# Patient Record
Sex: Female | Born: 1950
Health system: Southern US, Community
[De-identification: ages and names within clinical notes are randomized; demographics above are authoritative.]

## PROBLEM LIST (undated history)

## (undated) DIAGNOSIS — M8580 Other specified disorders of bone density and structure, unspecified site: Secondary | ICD-10-CM

## (undated) DIAGNOSIS — E559 Vitamin D deficiency, unspecified: Secondary | ICD-10-CM

## (undated) DIAGNOSIS — J45909 Unspecified asthma, uncomplicated: Secondary | ICD-10-CM

## (undated) DIAGNOSIS — M1990 Unspecified osteoarthritis, unspecified site: Secondary | ICD-10-CM

## (undated) DIAGNOSIS — K635 Polyp of colon: Secondary | ICD-10-CM

## (undated) DIAGNOSIS — G629 Polyneuropathy, unspecified: Secondary | ICD-10-CM

## (undated) DIAGNOSIS — C4490 Unspecified malignant neoplasm of skin, unspecified: Secondary | ICD-10-CM

## (undated) DIAGNOSIS — Z8669 Personal history of other diseases of the nervous system and sense organs: Secondary | ICD-10-CM

## (undated) DIAGNOSIS — L509 Urticaria, unspecified: Secondary | ICD-10-CM

## (undated) HISTORY — PX: MASTECTOMY, RADICAL: SHX710

## (undated) HISTORY — PX: APPENDECTOMY: SHX54

## (undated) HISTORY — DX: Unspecified osteoarthritis, unspecified site: M19.90

## (undated) HISTORY — DX: Personal history of other diseases of the nervous system and sense organs: Z86.69

## (undated) HISTORY — DX: Vitamin D deficiency, unspecified: E55.9

## (undated) HISTORY — PX: CHOLECYSTECTOMY: SHX55

## (undated) HISTORY — DX: Urticaria, unspecified: L50.9

## (undated) HISTORY — DX: Unspecified malignant neoplasm of skin, unspecified: C44.90

## (undated) HISTORY — DX: Unspecified asthma, uncomplicated: J45.909

## (undated) HISTORY — DX: Polyp of colon: K63.5

## (undated) HISTORY — DX: Other specified disorders of bone density and structure, unspecified site: M85.80

## (undated) HISTORY — DX: Polyneuropathy, unspecified: G62.9

---

## 1996-04-06 HISTORY — PX: CORONARY ANGIOPLASTY WITH STENT PLACEMENT: SHX49

## 1999-09-18 ENCOUNTER — Inpatient Hospital Stay (HOSPITAL_COMMUNITY): Admission: EM | Admit: 1999-09-18 | Discharge: 1999-09-19 | Payer: Self-pay | Admitting: Cardiology

## 2000-09-18 ENCOUNTER — Inpatient Hospital Stay (HOSPITAL_COMMUNITY): Admission: EM | Admit: 2000-09-18 | Discharge: 2000-09-20 | Payer: Self-pay | Admitting: Cardiovascular Disease

## 2010-08-22 NOTE — Discharge Summary (Signed)
[place]  Patient:    Jackie Gibson, Jackie Gibson                      MRN: [id number] Adm. Date:  [id number] Disc. Date: [id number] Attending:  Colon Branch Dictator:   Shayne Alken, C.R.N.P.                           Discharge Summary  HISTORY OF PRESENT ILLNESS:  This is a 60 year old female with a history of atrial fibrillation/atrial flutter associated with syncope in 1998.  Other history significant for asthma which is mild, migraine headaches, status post appendectomy and hysterectomy.  The patient had a negative tilt test in 1998, but was treated with Florinef without improvement.  She was seen at Urgent Care in [place] the day of admission with dizziness and weakness.  She was transferred here per her request for further evaluation of symptoms.  She was noted to be in sinus rhythm, "I feel terrible."  Heart felt all week, yesterday was worse.  She felt her heart was out of rhythm.  She was fatigued and weak with dizziness and presyncope when she stands up and dyspnea on exertion.  HOSPITAL COURSE:  The patient was admitted.  Orthostatic blood pressures were checked without significant change.  She was hydrated and placed on telemetry monitoring.  There was no significant change in telemetry monitoring, no arrhythmias were noted.  Some nocturnal bradycardia.  She was discharged to home to follow up with Dr. Samuel Germany, her family physician, and Rudene Christians. Ladona Ridgel, M.D. [facility] as needed.  She was discharged on all of her previous medications prior to admission and to follow up with Dr. Samuel Germany.  DISCHARGE MEDICATIONS: 1. Aspirin. 2. Multivitamins. 3. Albuterol. 4. Metamucil. DD:  09/20/00 TD:  09/20/00 Job: [id number]

## 2010-08-22 NOTE — Discharge Summary (Signed)
[place]  Patient:    Jackie Gibson, Jackie Gibson                      MRN: [id number] Adm. Date:  [id number] Disc. Date: 09/19/99 Attending:  Learta Codding Dictator:   Leonides Cave, P.A. CC:         Rudene Christians. Ladona Ridgel, M.D. [facility]             Dr. Roma Schanz, [place]                           Discharge Summary  DATE OF BIRTH:  Nov 12, 1950  DISCHARGE DIAGNOSES: 1. Syncope with history of negative head up tilt table test in the past. 2. History of atrial fibrillation and atrial flutter, paroxysmal. 3. Status post hysterectomy. 4. Status post appendectomy. 5. History of migraine headaches. 6. History of mild asthma.  BRIEF HISTORY:  The patient is a 60 year old white female with a three-year history of dizziness and syncopal spells.  She had a negative tilt table test in 1998.  The patient saw Dr. Sharrell Ku in the EP office two weeks prior to this admission and a Holter monitor was placed for dizziness and syncope and presyncopal spells.  On the day of admission, she had three episodes of frank syncope associated with dizziness and "the room spinning around."  The patient is aware that she is about to pass out and spells have occurred while standing.  On awakening, she has no loss of continence and she feels mildly fatigued and dizzy for several minutes.  She has no chest pain or shortness of breath.  She had monitor activated after the third spell.  The patient has a history of paroxysmal atrial fibrillation and flutter and no significant coronary disease by heart catheterization.  The patient was admitted to rule out any ischemic cause of syncope and to watch on telemetry.  HOSPITAL COURSE:  The patient ruled out for myocardial infarction.  Dr. Sharrell Ku saw her again on September 19, 1999 and felt she should be discharged home and to continue to wear the 30-day monitor.  FOLLOW-UP:  The patient will follow up with Dr. Ladona Ridgel in the EP clinic on October 15, 1999 at 10:15 a.m.  At that time, an implantable looper will be scheduled.  DISCHARGE MEDICATIONS:  The patient will continue the same medications she came in on.  DISCHARGE INSTRUCTIONS:  The patient was told not to drive until she sees Dr. Ladona Ridgel in follow-up. DD:  09/19/99 TD:  09/19/99 Job: [id number]

## 2015-02-15 DIAGNOSIS — J189 Pneumonia, unspecified organism: Secondary | ICD-10-CM | POA: Diagnosis not present

## 2015-02-15 DIAGNOSIS — J040 Acute laryngitis: Secondary | ICD-10-CM | POA: Diagnosis not present

## 2015-02-15 DIAGNOSIS — J069 Acute upper respiratory infection, unspecified: Secondary | ICD-10-CM | POA: Diagnosis not present

## 2015-03-05 DIAGNOSIS — C50911 Malignant neoplasm of unspecified site of right female breast: Secondary | ICD-10-CM | POA: Diagnosis not present

## 2015-03-05 DIAGNOSIS — Z853 Personal history of malignant neoplasm of breast: Secondary | ICD-10-CM | POA: Diagnosis not present

## 2015-03-05 DIAGNOSIS — Z170 Estrogen receptor positive status [ER+]: Secondary | ICD-10-CM | POA: Diagnosis not present

## 2015-03-05 DIAGNOSIS — Z79811 Long term (current) use of aromatase inhibitors: Secondary | ICD-10-CM | POA: Diagnosis not present

## 2015-03-10 DIAGNOSIS — J9601 Acute respiratory failure with hypoxia: Secondary | ICD-10-CM | POA: Diagnosis not present

## 2015-03-10 DIAGNOSIS — Z9011 Acquired absence of right breast and nipple: Secondary | ICD-10-CM | POA: Diagnosis not present

## 2015-03-10 DIAGNOSIS — Z716 Tobacco abuse counseling: Secondary | ICD-10-CM | POA: Diagnosis not present

## 2015-03-10 DIAGNOSIS — Z853 Personal history of malignant neoplasm of breast: Secondary | ICD-10-CM | POA: Diagnosis not present

## 2015-03-10 DIAGNOSIS — R0600 Dyspnea, unspecified: Secondary | ICD-10-CM | POA: Diagnosis not present

## 2015-03-10 DIAGNOSIS — R0689 Other abnormalities of breathing: Secondary | ICD-10-CM | POA: Diagnosis not present

## 2015-03-10 DIAGNOSIS — F17210 Nicotine dependence, cigarettes, uncomplicated: Secondary | ICD-10-CM | POA: Diagnosis present

## 2015-03-10 DIAGNOSIS — R05 Cough: Secondary | ICD-10-CM | POA: Diagnosis not present

## 2015-03-10 DIAGNOSIS — J189 Pneumonia, unspecified organism: Secondary | ICD-10-CM | POA: Diagnosis not present

## 2015-03-10 DIAGNOSIS — R0602 Shortness of breath: Secondary | ICD-10-CM | POA: Diagnosis not present

## 2015-03-10 DIAGNOSIS — R079 Chest pain, unspecified: Secondary | ICD-10-CM | POA: Diagnosis not present

## 2015-03-10 DIAGNOSIS — Z720 Tobacco use: Secondary | ICD-10-CM | POA: Diagnosis not present

## 2015-03-10 DIAGNOSIS — Z79899 Other long term (current) drug therapy: Secondary | ICD-10-CM | POA: Diagnosis not present

## 2015-03-10 DIAGNOSIS — J209 Acute bronchitis, unspecified: Secondary | ICD-10-CM | POA: Diagnosis not present

## 2015-03-10 DIAGNOSIS — C50911 Malignant neoplasm of unspecified site of right female breast: Secondary | ICD-10-CM | POA: Diagnosis not present

## 2015-03-20 DIAGNOSIS — J449 Chronic obstructive pulmonary disease, unspecified: Secondary | ICD-10-CM | POA: Diagnosis not present

## 2015-03-20 DIAGNOSIS — M25561 Pain in right knee: Secondary | ICD-10-CM | POA: Diagnosis not present

## 2015-03-20 DIAGNOSIS — F17210 Nicotine dependence, cigarettes, uncomplicated: Secondary | ICD-10-CM | POA: Diagnosis not present

## 2015-04-02 DIAGNOSIS — M11261 Other chondrocalcinosis, right knee: Secondary | ICD-10-CM | POA: Diagnosis not present

## 2015-04-02 DIAGNOSIS — M1711 Unilateral primary osteoarthritis, right knee: Secondary | ICD-10-CM | POA: Diagnosis not present

## 2015-04-02 DIAGNOSIS — Z853 Personal history of malignant neoplasm of breast: Secondary | ICD-10-CM | POA: Diagnosis not present

## 2015-04-02 DIAGNOSIS — Z452 Encounter for adjustment and management of vascular access device: Secondary | ICD-10-CM | POA: Diagnosis not present

## 2015-04-02 DIAGNOSIS — M179 Osteoarthritis of knee, unspecified: Secondary | ICD-10-CM | POA: Diagnosis not present

## 2015-04-25 DIAGNOSIS — M1711 Unilateral primary osteoarthritis, right knee: Secondary | ICD-10-CM | POA: Diagnosis not present

## 2015-05-01 DIAGNOSIS — Z853 Personal history of malignant neoplasm of breast: Secondary | ICD-10-CM | POA: Diagnosis not present

## 2015-05-01 DIAGNOSIS — Z452 Encounter for adjustment and management of vascular access device: Secondary | ICD-10-CM | POA: Diagnosis not present

## 2015-05-14 DIAGNOSIS — C50111 Malignant neoplasm of central portion of right female breast: Secondary | ICD-10-CM | POA: Diagnosis not present

## 2015-05-14 DIAGNOSIS — Z09 Encounter for follow-up examination after completed treatment for conditions other than malignant neoplasm: Secondary | ICD-10-CM

## 2015-05-14 HISTORY — DX: Encounter for follow-up examination after completed treatment for conditions other than malignant neoplasm: Z09

## 2015-05-14 HISTORY — DX: Malignant neoplasm of central portion of right female breast: C50.111

## 2015-09-03 DIAGNOSIS — C50911 Malignant neoplasm of unspecified site of right female breast: Secondary | ICD-10-CM | POA: Diagnosis not present

## 2015-09-03 DIAGNOSIS — Z853 Personal history of malignant neoplasm of breast: Secondary | ICD-10-CM | POA: Diagnosis not present

## 2015-09-03 DIAGNOSIS — Z170 Estrogen receptor positive status [ER+]: Secondary | ICD-10-CM | POA: Diagnosis not present

## 2015-09-03 DIAGNOSIS — Z79811 Long term (current) use of aromatase inhibitors: Secondary | ICD-10-CM | POA: Diagnosis not present

## 2015-11-06 DIAGNOSIS — L820 Inflamed seborrheic keratosis: Secondary | ICD-10-CM | POA: Diagnosis not present

## 2015-11-29 DIAGNOSIS — Z1231 Encounter for screening mammogram for malignant neoplasm of breast: Secondary | ICD-10-CM | POA: Diagnosis not present

## 2015-12-02 DIAGNOSIS — D499 Neoplasm of unspecified behavior of unspecified site: Secondary | ICD-10-CM

## 2015-12-02 DIAGNOSIS — C50111 Malignant neoplasm of central portion of right female breast: Secondary | ICD-10-CM | POA: Diagnosis not present

## 2015-12-02 DIAGNOSIS — Z170 Estrogen receptor positive status [ER+]: Secondary | ICD-10-CM | POA: Diagnosis not present

## 2015-12-02 HISTORY — DX: Neoplasm of unspecified behavior of unspecified site: D49.9

## 2015-12-02 HISTORY — DX: Neoplasm of unspecified behavior of unspecified site: Z17.0

## 2015-12-31 DIAGNOSIS — Z23 Encounter for immunization: Secondary | ICD-10-CM | POA: Diagnosis not present

## 2016-01-01 DIAGNOSIS — L820 Inflamed seborrheic keratosis: Secondary | ICD-10-CM | POA: Diagnosis not present

## 2016-01-09 DIAGNOSIS — J069 Acute upper respiratory infection, unspecified: Secondary | ICD-10-CM | POA: Diagnosis not present

## 2016-01-09 DIAGNOSIS — J209 Acute bronchitis, unspecified: Secondary | ICD-10-CM | POA: Diagnosis not present

## 2016-03-05 DIAGNOSIS — Z170 Estrogen receptor positive status [ER+]: Secondary | ICD-10-CM | POA: Diagnosis not present

## 2016-03-05 DIAGNOSIS — Z79811 Long term (current) use of aromatase inhibitors: Secondary | ICD-10-CM | POA: Diagnosis not present

## 2016-03-05 DIAGNOSIS — Z853 Personal history of malignant neoplasm of breast: Secondary | ICD-10-CM | POA: Diagnosis not present

## 2016-03-05 DIAGNOSIS — C50211 Malignant neoplasm of upper-inner quadrant of right female breast: Secondary | ICD-10-CM | POA: Diagnosis not present

## 2016-11-19 DIAGNOSIS — E559 Vitamin D deficiency, unspecified: Secondary | ICD-10-CM | POA: Diagnosis not present

## 2016-11-19 DIAGNOSIS — Z13220 Encounter for screening for lipoid disorders: Secondary | ICD-10-CM | POA: Diagnosis not present

## 2016-11-19 DIAGNOSIS — E2839 Other primary ovarian failure: Secondary | ICD-10-CM | POA: Diagnosis not present

## 2016-11-19 DIAGNOSIS — R5383 Other fatigue: Secondary | ICD-10-CM | POA: Diagnosis not present

## 2016-11-19 DIAGNOSIS — Z0001 Encounter for general adult medical examination with abnormal findings: Secondary | ICD-10-CM | POA: Diagnosis not present

## 2016-11-19 DIAGNOSIS — Z23 Encounter for immunization: Secondary | ICD-10-CM | POA: Diagnosis not present

## 2016-11-23 DIAGNOSIS — Z0100 Encounter for examination of eyes and vision without abnormal findings: Secondary | ICD-10-CM | POA: Diagnosis not present

## 2016-11-23 DIAGNOSIS — Z0101 Encounter for examination of eyes and vision with abnormal findings: Secondary | ICD-10-CM | POA: Diagnosis not present

## 2016-12-01 DIAGNOSIS — Z1231 Encounter for screening mammogram for malignant neoplasm of breast: Secondary | ICD-10-CM | POA: Diagnosis not present

## 2016-12-01 DIAGNOSIS — R69 Illness, unspecified: Secondary | ICD-10-CM | POA: Diagnosis not present

## 2016-12-03 DIAGNOSIS — N6321 Unspecified lump in the left breast, upper outer quadrant: Secondary | ICD-10-CM | POA: Diagnosis not present

## 2016-12-03 DIAGNOSIS — N6002 Solitary cyst of left breast: Secondary | ICD-10-CM | POA: Diagnosis not present

## 2016-12-03 DIAGNOSIS — N6323 Unspecified lump in the left breast, lower outer quadrant: Secondary | ICD-10-CM | POA: Diagnosis not present

## 2016-12-03 DIAGNOSIS — R922 Inconclusive mammogram: Secondary | ICD-10-CM | POA: Diagnosis not present

## 2016-12-14 DIAGNOSIS — R69 Illness, unspecified: Secondary | ICD-10-CM | POA: Diagnosis not present

## 2016-12-14 DIAGNOSIS — J0100 Acute maxillary sinusitis, unspecified: Secondary | ICD-10-CM | POA: Diagnosis not present

## 2016-12-28 DIAGNOSIS — N6019 Diffuse cystic mastopathy of unspecified breast: Secondary | ICD-10-CM

## 2016-12-28 DIAGNOSIS — Z853 Personal history of malignant neoplasm of breast: Secondary | ICD-10-CM

## 2016-12-28 DIAGNOSIS — Z1211 Encounter for screening for malignant neoplasm of colon: Secondary | ICD-10-CM | POA: Diagnosis not present

## 2016-12-28 HISTORY — DX: Diffuse cystic mastopathy of unspecified breast: N60.19

## 2016-12-28 HISTORY — DX: Personal history of malignant neoplasm of breast: Z85.3

## 2017-03-03 DIAGNOSIS — R5383 Other fatigue: Secondary | ICD-10-CM | POA: Diagnosis not present

## 2017-03-03 DIAGNOSIS — R062 Wheezing: Secondary | ICD-10-CM | POA: Diagnosis not present

## 2017-03-03 DIAGNOSIS — E559 Vitamin D deficiency, unspecified: Secondary | ICD-10-CM | POA: Diagnosis not present

## 2017-03-03 DIAGNOSIS — G629 Polyneuropathy, unspecified: Secondary | ICD-10-CM | POA: Diagnosis not present

## 2017-03-03 DIAGNOSIS — J0140 Acute pansinusitis, unspecified: Secondary | ICD-10-CM | POA: Diagnosis not present

## 2017-03-04 DIAGNOSIS — Z170 Estrogen receptor positive status [ER+]: Secondary | ICD-10-CM | POA: Diagnosis not present

## 2017-03-04 DIAGNOSIS — Z923 Personal history of irradiation: Secondary | ICD-10-CM | POA: Diagnosis not present

## 2017-03-04 DIAGNOSIS — Z9221 Personal history of antineoplastic chemotherapy: Secondary | ICD-10-CM | POA: Diagnosis not present

## 2017-03-04 DIAGNOSIS — Z9011 Acquired absence of right breast and nipple: Secondary | ICD-10-CM | POA: Diagnosis not present

## 2017-03-04 DIAGNOSIS — C50211 Malignant neoplasm of upper-inner quadrant of right female breast: Secondary | ICD-10-CM | POA: Diagnosis not present

## 2017-03-04 DIAGNOSIS — Z853 Personal history of malignant neoplasm of breast: Secondary | ICD-10-CM | POA: Diagnosis not present

## 2017-07-16 DIAGNOSIS — E559 Vitamin D deficiency, unspecified: Secondary | ICD-10-CM | POA: Diagnosis not present

## 2017-07-16 DIAGNOSIS — M47816 Spondylosis without myelopathy or radiculopathy, lumbar region: Secondary | ICD-10-CM | POA: Diagnosis not present

## 2017-07-16 DIAGNOSIS — M2550 Pain in unspecified joint: Secondary | ICD-10-CM | POA: Diagnosis not present

## 2017-07-16 DIAGNOSIS — G629 Polyneuropathy, unspecified: Secondary | ICD-10-CM | POA: Diagnosis not present

## 2017-07-16 DIAGNOSIS — R21 Rash and other nonspecific skin eruption: Secondary | ICD-10-CM | POA: Diagnosis not present

## 2017-07-16 DIAGNOSIS — Z1159 Encounter for screening for other viral diseases: Secondary | ICD-10-CM | POA: Diagnosis not present

## 2017-07-16 DIAGNOSIS — M25552 Pain in left hip: Secondary | ICD-10-CM | POA: Diagnosis not present

## 2017-07-16 DIAGNOSIS — R252 Cramp and spasm: Secondary | ICD-10-CM | POA: Diagnosis not present

## 2017-07-16 DIAGNOSIS — R5383 Other fatigue: Secondary | ICD-10-CM | POA: Diagnosis not present

## 2017-08-20 DIAGNOSIS — M2550 Pain in unspecified joint: Secondary | ICD-10-CM | POA: Diagnosis not present

## 2017-08-20 DIAGNOSIS — R768 Other specified abnormal immunological findings in serum: Secondary | ICD-10-CM | POA: Diagnosis not present

## 2017-08-20 DIAGNOSIS — R5382 Chronic fatigue, unspecified: Secondary | ICD-10-CM | POA: Diagnosis not present

## 2017-08-20 DIAGNOSIS — Z6827 Body mass index (BMI) 27.0-27.9, adult: Secondary | ICD-10-CM | POA: Diagnosis not present

## 2017-08-20 DIAGNOSIS — E663 Overweight: Secondary | ICD-10-CM | POA: Diagnosis not present

## 2017-11-24 DIAGNOSIS — Z13220 Encounter for screening for lipoid disorders: Secondary | ICD-10-CM | POA: Diagnosis not present

## 2017-11-24 DIAGNOSIS — E7800 Pure hypercholesterolemia, unspecified: Secondary | ICD-10-CM | POA: Diagnosis not present

## 2017-11-24 DIAGNOSIS — J449 Chronic obstructive pulmonary disease, unspecified: Secondary | ICD-10-CM | POA: Diagnosis not present

## 2017-11-24 DIAGNOSIS — R69 Illness, unspecified: Secondary | ICD-10-CM | POA: Diagnosis not present

## 2017-11-24 DIAGNOSIS — Z23 Encounter for immunization: Secondary | ICD-10-CM | POA: Diagnosis not present

## 2017-11-24 DIAGNOSIS — Z0001 Encounter for general adult medical examination with abnormal findings: Secondary | ICD-10-CM | POA: Diagnosis not present

## 2017-11-24 DIAGNOSIS — R739 Hyperglycemia, unspecified: Secondary | ICD-10-CM | POA: Diagnosis not present

## 2017-11-24 DIAGNOSIS — Z1389 Encounter for screening for other disorder: Secondary | ICD-10-CM | POA: Diagnosis not present

## 2017-11-24 DIAGNOSIS — Z1331 Encounter for screening for depression: Secondary | ICD-10-CM | POA: Diagnosis not present

## 2017-11-24 DIAGNOSIS — E2839 Other primary ovarian failure: Secondary | ICD-10-CM | POA: Diagnosis not present

## 2017-11-24 DIAGNOSIS — Z131 Encounter for screening for diabetes mellitus: Secondary | ICD-10-CM | POA: Diagnosis not present

## 2017-11-26 DIAGNOSIS — L0390 Cellulitis, unspecified: Secondary | ICD-10-CM | POA: Diagnosis not present

## 2017-12-02 DIAGNOSIS — Z1231 Encounter for screening mammogram for malignant neoplasm of breast: Secondary | ICD-10-CM | POA: Diagnosis not present

## 2017-12-20 DIAGNOSIS — R928 Other abnormal and inconclusive findings on diagnostic imaging of breast: Secondary | ICD-10-CM | POA: Diagnosis not present

## 2017-12-20 DIAGNOSIS — N6489 Other specified disorders of breast: Secondary | ICD-10-CM | POA: Diagnosis not present

## 2017-12-20 DIAGNOSIS — N6012 Diffuse cystic mastopathy of left breast: Secondary | ICD-10-CM | POA: Diagnosis not present

## 2018-01-03 DIAGNOSIS — N6019 Diffuse cystic mastopathy of unspecified breast: Secondary | ICD-10-CM | POA: Diagnosis not present

## 2018-01-03 DIAGNOSIS — Z853 Personal history of malignant neoplasm of breast: Secondary | ICD-10-CM | POA: Diagnosis not present

## 2018-01-03 DIAGNOSIS — Z1211 Encounter for screening for malignant neoplasm of colon: Secondary | ICD-10-CM | POA: Diagnosis not present

## 2018-02-24 DIAGNOSIS — H35371 Puckering of macula, right eye: Secondary | ICD-10-CM | POA: Diagnosis not present

## 2018-02-24 DIAGNOSIS — H43813 Vitreous degeneration, bilateral: Secondary | ICD-10-CM | POA: Diagnosis not present

## 2018-03-21 DIAGNOSIS — Z9011 Acquired absence of right breast and nipple: Secondary | ICD-10-CM | POA: Diagnosis not present

## 2018-03-21 DIAGNOSIS — Z170 Estrogen receptor positive status [ER+]: Secondary | ICD-10-CM | POA: Diagnosis not present

## 2018-03-21 DIAGNOSIS — Z923 Personal history of irradiation: Secondary | ICD-10-CM | POA: Diagnosis not present

## 2018-03-21 DIAGNOSIS — Z853 Personal history of malignant neoplasm of breast: Secondary | ICD-10-CM | POA: Diagnosis not present

## 2018-03-21 DIAGNOSIS — Z9221 Personal history of antineoplastic chemotherapy: Secondary | ICD-10-CM | POA: Diagnosis not present

## 2018-04-07 DIAGNOSIS — H35373 Puckering of macula, bilateral: Secondary | ICD-10-CM | POA: Diagnosis not present

## 2018-04-07 DIAGNOSIS — H43813 Vitreous degeneration, bilateral: Secondary | ICD-10-CM | POA: Diagnosis not present

## 2018-06-16 DIAGNOSIS — H4311 Vitreous hemorrhage, right eye: Secondary | ICD-10-CM | POA: Diagnosis not present

## 2018-06-16 DIAGNOSIS — H35373 Puckering of macula, bilateral: Secondary | ICD-10-CM | POA: Diagnosis not present

## 2018-06-16 DIAGNOSIS — H43813 Vitreous degeneration, bilateral: Secondary | ICD-10-CM | POA: Diagnosis not present

## 2018-11-28 DIAGNOSIS — J449 Chronic obstructive pulmonary disease, unspecified: Secondary | ICD-10-CM | POA: Diagnosis not present

## 2018-11-28 DIAGNOSIS — R739 Hyperglycemia, unspecified: Secondary | ICD-10-CM | POA: Diagnosis not present

## 2018-11-28 DIAGNOSIS — R1012 Left upper quadrant pain: Secondary | ICD-10-CM | POA: Diagnosis not present

## 2018-11-28 DIAGNOSIS — Z1339 Encounter for screening examination for other mental health and behavioral disorders: Secondary | ICD-10-CM | POA: Diagnosis not present

## 2018-11-28 DIAGNOSIS — Z853 Personal history of malignant neoplasm of breast: Secondary | ICD-10-CM | POA: Diagnosis not present

## 2018-11-28 DIAGNOSIS — E785 Hyperlipidemia, unspecified: Secondary | ICD-10-CM | POA: Diagnosis not present

## 2018-11-28 DIAGNOSIS — E663 Overweight: Secondary | ICD-10-CM | POA: Diagnosis not present

## 2018-11-28 DIAGNOSIS — Z6827 Body mass index (BMI) 27.0-27.9, adult: Secondary | ICD-10-CM | POA: Diagnosis not present

## 2018-11-28 DIAGNOSIS — R69 Illness, unspecified: Secondary | ICD-10-CM | POA: Diagnosis not present

## 2018-11-28 DIAGNOSIS — Z1331 Encounter for screening for depression: Secondary | ICD-10-CM | POA: Diagnosis not present

## 2018-11-28 DIAGNOSIS — Z0000 Encounter for general adult medical examination without abnormal findings: Secondary | ICD-10-CM | POA: Diagnosis not present

## 2018-12-05 DIAGNOSIS — R1012 Left upper quadrant pain: Secondary | ICD-10-CM | POA: Diagnosis not present

## 2018-12-05 DIAGNOSIS — J449 Chronic obstructive pulmonary disease, unspecified: Secondary | ICD-10-CM | POA: Diagnosis not present

## 2018-12-05 DIAGNOSIS — R05 Cough: Secondary | ICD-10-CM | POA: Diagnosis not present

## 2018-12-05 DIAGNOSIS — Z1231 Encounter for screening mammogram for malignant neoplasm of breast: Secondary | ICD-10-CM | POA: Diagnosis not present

## 2018-12-05 DIAGNOSIS — R079 Chest pain, unspecified: Secondary | ICD-10-CM | POA: Diagnosis not present

## 2018-12-21 ENCOUNTER — Other Ambulatory Visit: Payer: Self-pay | Admitting: Vascular Surgery

## 2018-12-21 DIAGNOSIS — N6489 Other specified disorders of breast: Secondary | ICD-10-CM

## 2018-12-21 DIAGNOSIS — N6002 Solitary cyst of left breast: Secondary | ICD-10-CM | POA: Diagnosis not present

## 2018-12-21 DIAGNOSIS — R928 Other abnormal and inconclusive findings on diagnostic imaging of breast: Secondary | ICD-10-CM | POA: Diagnosis not present

## 2018-12-26 DIAGNOSIS — M8589 Other specified disorders of bone density and structure, multiple sites: Secondary | ICD-10-CM | POA: Diagnosis not present

## 2018-12-26 DIAGNOSIS — E2839 Other primary ovarian failure: Secondary | ICD-10-CM | POA: Diagnosis not present

## 2019-01-02 ENCOUNTER — Other Ambulatory Visit (HOSPITAL_COMMUNITY): Payer: Self-pay | Admitting: Diagnostic Radiology

## 2019-01-02 ENCOUNTER — Ambulatory Visit
Admission: RE | Admit: 2019-01-02 | Discharge: 2019-01-02 | Disposition: A | Discharge status: Home / Self Care | Payer: Medicare HMO | Source: Ambulatory Visit | Attending: Vascular Surgery | Admitting: Vascular Surgery

## 2019-01-02 ENCOUNTER — Other Ambulatory Visit: Payer: Self-pay

## 2019-01-02 DIAGNOSIS — N6489 Other specified disorders of breast: Secondary | ICD-10-CM

## 2019-01-02 DIAGNOSIS — N62 Hypertrophy of breast: Secondary | ICD-10-CM | POA: Diagnosis not present

## 2019-01-09 DIAGNOSIS — N6323 Unspecified lump in the left breast, lower outer quadrant: Secondary | ICD-10-CM

## 2019-01-09 DIAGNOSIS — Z853 Personal history of malignant neoplasm of breast: Secondary | ICD-10-CM | POA: Diagnosis not present

## 2019-01-09 DIAGNOSIS — Z1211 Encounter for screening for malignant neoplasm of colon: Secondary | ICD-10-CM | POA: Diagnosis not present

## 2019-01-09 HISTORY — DX: Unspecified lump in the left breast, lower outer quadrant: N63.23

## 2019-01-17 DIAGNOSIS — L821 Other seborrheic keratosis: Secondary | ICD-10-CM | POA: Diagnosis not present

## 2019-01-17 DIAGNOSIS — C44629 Squamous cell carcinoma of skin of left upper limb, including shoulder: Secondary | ICD-10-CM | POA: Diagnosis not present

## 2019-01-17 DIAGNOSIS — L820 Inflamed seborrheic keratosis: Secondary | ICD-10-CM | POA: Diagnosis not present

## 2019-01-17 DIAGNOSIS — L570 Actinic keratosis: Secondary | ICD-10-CM | POA: Diagnosis not present

## 2019-03-17 DIAGNOSIS — R69 Illness, unspecified: Secondary | ICD-10-CM | POA: Diagnosis not present

## 2019-04-27 DIAGNOSIS — C50211 Malignant neoplasm of upper-inner quadrant of right female breast: Secondary | ICD-10-CM | POA: Diagnosis not present

## 2019-04-27 DIAGNOSIS — Z853 Personal history of malignant neoplasm of breast: Secondary | ICD-10-CM | POA: Diagnosis not present

## 2019-06-22 DIAGNOSIS — H25813 Combined forms of age-related cataract, bilateral: Secondary | ICD-10-CM | POA: Diagnosis not present

## 2019-06-22 DIAGNOSIS — H35371 Puckering of macula, right eye: Secondary | ICD-10-CM | POA: Diagnosis not present

## 2019-06-22 DIAGNOSIS — H43811 Vitreous degeneration, right eye: Secondary | ICD-10-CM | POA: Diagnosis not present

## 2019-09-26 DIAGNOSIS — R001 Bradycardia, unspecified: Secondary | ICD-10-CM | POA: Diagnosis not present

## 2019-09-26 DIAGNOSIS — R5383 Other fatigue: Secondary | ICD-10-CM | POA: Diagnosis not present

## 2019-10-16 DIAGNOSIS — R002 Palpitations: Secondary | ICD-10-CM | POA: Diagnosis not present

## 2019-10-16 DIAGNOSIS — R9431 Abnormal electrocardiogram [ECG] [EKG]: Secondary | ICD-10-CM | POA: Diagnosis not present

## 2019-10-31 ENCOUNTER — Encounter: Payer: Self-pay | Admitting: *Deleted

## 2019-10-31 ENCOUNTER — Encounter: Payer: Self-pay | Admitting: Cardiology

## 2019-10-31 DIAGNOSIS — M1990 Unspecified osteoarthritis, unspecified site: Secondary | ICD-10-CM | POA: Insufficient documentation

## 2019-10-31 DIAGNOSIS — K635 Polyp of colon: Secondary | ICD-10-CM | POA: Insufficient documentation

## 2019-10-31 DIAGNOSIS — C4490 Unspecified malignant neoplasm of skin, unspecified: Secondary | ICD-10-CM | POA: Insufficient documentation

## 2019-10-31 DIAGNOSIS — Z8669 Personal history of other diseases of the nervous system and sense organs: Secondary | ICD-10-CM | POA: Insufficient documentation

## 2019-10-31 DIAGNOSIS — J45909 Unspecified asthma, uncomplicated: Secondary | ICD-10-CM | POA: Insufficient documentation

## 2019-10-31 DIAGNOSIS — L509 Urticaria, unspecified: Secondary | ICD-10-CM | POA: Insufficient documentation

## 2019-10-31 DIAGNOSIS — G629 Polyneuropathy, unspecified: Secondary | ICD-10-CM | POA: Insufficient documentation

## 2019-10-31 DIAGNOSIS — E559 Vitamin D deficiency, unspecified: Secondary | ICD-10-CM | POA: Insufficient documentation

## 2019-10-31 DIAGNOSIS — M8580 Other specified disorders of bone density and structure, unspecified site: Secondary | ICD-10-CM | POA: Insufficient documentation

## 2019-11-01 ENCOUNTER — Encounter: Payer: Self-pay | Admitting: Cardiology

## 2019-11-01 ENCOUNTER — Other Ambulatory Visit: Payer: Self-pay

## 2019-11-01 ENCOUNTER — Ambulatory Visit: Payer: Medicare HMO | Admitting: Cardiology

## 2019-11-01 VITALS — BP 120/70 | HR 98 | Ht 66.75 in | Wt 169.0 lb

## 2019-11-01 DIAGNOSIS — R072 Precordial pain: Secondary | ICD-10-CM

## 2019-11-01 DIAGNOSIS — R5383 Other fatigue: Secondary | ICD-10-CM

## 2019-11-01 DIAGNOSIS — R9431 Abnormal electrocardiogram [ECG] [EKG]: Secondary | ICD-10-CM

## 2019-11-01 DIAGNOSIS — R0602 Shortness of breath: Secondary | ICD-10-CM

## 2019-11-01 MED ORDER — PREDNISONE 50 MG PO TABS
ORAL_TABLET | ORAL | 0 refills | Status: DC
Start: 2019-11-01 — End: 2020-02-01

## 2019-11-01 MED ORDER — METOPROLOL TARTRATE 100 MG PO TABS
100.0000 mg | ORAL_TABLET | Freq: Once | ORAL | 0 refills | Status: DC
Start: 2019-11-01 — End: 2020-02-01

## 2019-11-01 MED ORDER — NITROGLYCERIN 0.4 MG SL SUBL: 0.4000 mg | SUBLINGUAL_TABLET | SUBLINGUAL | 11 refills | Status: AC | PRN

## 2019-11-01 NOTE — Progress Notes (Signed)
Cardiology Office Note:    Date:  11/01/2019   ID:  Avie Arenas, DOB [birth year], MRN [id number]  PCP:  Ernestene Kiel, MD  Cardiologist:  Berniece Salines, DO  Electrophysiologist:  None   Referring MD: Ernestene Kiel, MD   I am here because I was referred by my PCP for abnormal EKG as well as chest pain or shortness of breath.    History of Present Illness:    Sianna Garofano Rondi Ivy is a 69 y.o. female with a hx of  Stage IIB (T2 N1a M0)  Hormone positive breast cancer, status post a right modified radical mastectomy in August 2012, status post taxotere/Cytoxan which per records she was only able to get 3 cycles due to severe toxicity of the chemotherapy.  She subsequently underwent adjuvant breast radiation and took letrozole for more than 5 years.    Per records she did see her oncologist in January 2021.  The patient tells me that she has been experiencing significant shortness of breath on exertion.  Was most concerning that recently she has had intermittent chest pain.  She described as a left-sided dull sensation which does not radiate to last for few minutes prior to resolution.  In addition there has been some fatigue.  She notes that whenever disease going on she gets her heart rate which is usually in the 40s.  But when she gets up to walk discussed better.  But she is not dizzy.  She did talk to her PCP about this at that time she had a EKG done which showed concerning for R wave progression suggesting anterior wall infarction.  Past Medical History:  Diagnosis Date  . Asthma   . Colon polyps   . Estrogen receptor positive neoplasm 12/02/2015  . Fibrocystic breast 12/28/2016  . History of breast cancer 12/28/2016  . Hx of migraine headaches   . Malignant neoplasm of central portion of right female breast ([place]) 05/14/2015  . Mass of lower outer quadrant of left breast 01/09/2019  . Neuropathy   . Osteoarthritis   . Osteopenia   . Skin cancer   . Urticaria     . Vitamin D deficiency     Past Surgical History:  Procedure Laterality Date  . APPENDECTOMY    . CESAREAN SECTION    . CHOLECYSTECTOMY    . CORONARY ANGIOPLASTY WITH STENT PLACEMENT  1998  . MASTECTOMY, RADICAL Right     Current Medications: Current Meds  Medication Sig  . albuterol (PROVENTIL) (2.5 MG/3ML) 0.083% nebulizer solution      Allergies:   Heparin, Ergocalciferol, Gatifloxacin, Iodine, Penicillin g, and Sulfa antibiotics   Social History   Socioeconomic History  . Marital status: Widowed    Spouse name: Not on file  . Number of children: Not on file  . Years of education: Not on file  . Highest education level: Not on file  Occupational History  . Not on file  Tobacco Use  . Smoking status: Current Every Day Smoker  . Smokeless tobacco: Never Used  Substance and Sexual Activity  . Alcohol use: Never  . Drug use: Never  . Sexual activity: Not on file  Other Topics Concern  . Not on file  Social History Narrative  . Not on file   Social Determinants of Health   Financial Resource Strain:   . Difficulty of Paying Living Expenses:   Food Insecurity:   . Worried About Charity fundraiser in the Last Year:   .  Ran Out of Food in the Last Year:   Transportation Needs:   . Film/video editor (Medical):   Marland Kitchen Lack of Transportation (Non-Medical):   Physical Activity:   . Days of Exercise per Week:   . Minutes of Exercise per Session:   Stress:   . Feeling of Stress :   Social Connections:   . Frequency of Communication with Friends and Family:   . Frequency of Social Gatherings with Friends and Family:   . Attends Religious Services:   . Active Member of Clubs or Organizations:   . Attends Archivist Meetings:   Marland Kitchen Marital Status:      Family History: The patient's family history includes Alzheimer's disease in her father; Stroke in her mother; Vascular Disease in her brother.  ROS:   Review of Systems  Constitution: Negative for  decreased appetite, fever and weight gain.  HENT: Negative for congestion, ear discharge, hoarse voice and sore throat.   Eyes: Negative for discharge, redness, vision loss in right eye and visual halos.  Cardiovascular: Negative for chest pain, dyspnea on exertion, leg swelling, orthopnea and palpitations.  Respiratory: Negative for cough, hemoptysis, shortness of breath and snoring.   Endocrine: Negative for heat intolerance and polyphagia.  Hematologic/Lymphatic: Negative for bleeding problem. Does not bruise/bleed easily.  Skin: Negative for flushing, nail changes, rash and suspicious lesions.  Musculoskeletal: Negative for arthritis, joint pain, muscle cramps, myalgias, neck pain and stiffness.  Gastrointestinal: Negative for abdominal pain, bowel incontinence, diarrhea and excessive appetite.  Genitourinary: Negative for decreased libido, genital sores and incomplete emptying.  Neurological: Negative for brief paralysis, focal weakness, headaches and loss of balance.  Psychiatric/Behavioral: Negative for altered mental status, depression and suicidal ideas.  Allergic/Immunologic: Negative for HIV exposure and persistent infections.    EKGs/Labs/Other Studies Reviewed:    The following studies were reviewed today:   EKG:  The ekg ordered today demonstrates sinus tachycardia, heart rate 101 bpm, left atrial enlargement.  Poor R wave progression which would be suggestive of anterior wall infarction of age indeterminate.      Echocardiogram done in 2012 showed EF of 55%.  With diastolic dysfunction , trace tricuspid regurgitation, trace mitral regurgitation and no other structural abnormalities.  Recent Labs: No results found for requested labs within last 8760 hours.  I was able to review her lab work from her PCP office which was done on October 05, 2019.: CBC: WBC 9.4, hemoglobin 16.0, hematocrit 48.6, platelets 272 Chemistry: ALT 17, AST 18, total bili 0.3, albumin 2.7, total protein  6.9, serum calcium 9.4, chloride 102, potassium 4.4, sodium 140, creatinine 0.72, BUN 10, bicarb 27, alk phos 87 Recent Lipid Panel No results found for: CHOL, TRIG, HDL, CHOLHDL, VLDL, LDLCALC, LDLDIRECT  Physical Exam:    VS:  BP 120/70 (BP Location: Left Arm, Patient Position: Sitting, Cuff Size: Normal)   Pulse 98   Ht 5' 6.75" (1.695 m)   Wt 169 lb (76.7 kg)   SpO2 91%   BMI 26.67 kg/m     Wt Readings from Last 3 Encounters:  11/01/19 169 lb (76.7 kg)  10/05/19 172 lb (78 kg)     GEN: Well nourished, well developed in no acute distress HEENT: Normal NECK: No JVD; No carotid bruits LYMPHATICS: No lymphadenopathy CARDIAC: S1S2 noted,RRR, no murmurs, rubs, gallops RESPIRATORY:  Clear to auscultation without rales, wheezing or rhonchi  ABDOMEN: Soft, non-tender, non-distended, +bowel sounds, no guarding. EXTREMITIES: No edema, No cyanosis, no clubbing MUSCULOSKELETAL:  No  deformity  SKIN: Warm and dry NEUROLOGIC:  Alert and oriented x 3, non-focal PSYCHIATRIC:  Normal affect, good insight  ASSESSMENT:    1. Precordial pain   2. Shortness of breath   3. Fatigue, unspecified type   4. Abnormal EKG    PLAN:    I am concerned given her chest pain and abnormal EKG, she does have intermediate risk for coronary artery disease which includes her age, history of chest radiation, smoker) therefore like to pursue an ischemic evaluation in this patient.  I have educated patient about this test and she is agreeable to proceed with the testing.  She does have contrast dye allergy or going to have to prep the patient with steroid prep prior to her procedure.  Sublingual nitroglycerin prescription was sent, its protocol and 911 protocol explained and the patient vocalized understanding questions were answered to the patient's satisfaction.  In terms of her shortness of breath given her history of radiation and her symptoms I like to pursue an echocardiogram to rule out any valvular  abnormalities.  Also help me to rule out radiation heart disease as well as if there is any residual effects from her previous chemo toxicity.  Fatigue-work-up is as noted as above.  The patient is in agreement with the above plan. The patient left the office in stable condition.  The patient will follow up in 3 months or sooner if needed   Medication Adjustments/Labs and Tests Ordered: Current medicines are reviewed at length with the patient today.  Concerns regarding medicines are outlined above.  Orders Placed This Encounter  Procedures  . CT CORONARY MORPH W/CTA COR W/SCORE W/CA W/CM &/OR WO/CM  . CT CORONARY FRACTIONAL FLOW RESERVE DATA PREP  . CT CORONARY FRACTIONAL FLOW RESERVE FLUID ANALYSIS  . Basic metabolic panel  . EKG 12-Lead  . ECHOCARDIOGRAM COMPLETE   Meds ordered this encounter  Medications  . nitroGLYCERIN (NITROSTAT) 0.4 MG SL tablet    Sig: Place 1 tablet (0.4 mg total) under the tongue every 5 (five) minutes as needed for chest pain.    Dispense:  25 tablet    Refill:  11  . metoprolol tartrate (LOPRESSOR) 100 MG tablet    Sig: Take 1 tablet (100 mg total) by mouth once for 1 dose. 2 hours before ct    Dispense:  1 tablet    Refill:  0  . predniSONE (DELTASONE) 50 MG tablet    Sig: Prednisone 50 mg - take 13 hours prior to test Take another Prednisone 50 mg 7 hours prior to test Take another Prednisone 50 mg 1 hour prior to test    Dispense:  3 tablet    Refill:  0    Patient Instructions  Medication Instructions:  Your physician has recommended you make the following change in your medication:   Take as needed for chest pain: Nitroglycerin 0.4 mg sublingual (under your tongue) as needed for chest pain. If experiencing chest pain, stop what you are doing and sit down. Take 1 nitroglycerin and wait 5 minutes. If chest pain continues, take another nitroglycerin and wait 5 minutes. If chest pain does not subside, take 1 more nitroglycerin and dial 911. You  make take a total of 3 nitroglycerin in a 15 minute time frame.   *If you need a refill on your cardiac medications before your next appointment, please call your pharmacy*   Lab Work: Your physician recommends that you return for lab work 3-7 days before ct: bmp  If you have labs (blood work) drawn today and your tests are completely normal, you will receive your results only by: Marland Kitchen MyChart Message (if you have MyChart) OR . A paper copy in the mail If you have any lab test that is abnormal or we need to change your treatment, we will call you to review the results.   Testing/Procedures:  Your physician has requested that you have an echocardiogram. Echocardiography is a painless test that uses sound waves to create images of your heart. It provides your doctor with information about the size and shape of your heart and how well your heart's chambers and valves are working. This procedure takes approximately one hour. There are no restrictions for this procedure.   Your cardiac CT will be scheduled at one of the below locations:   [facility] [address] [place], [place] [address] [phone]  [place] [address] [place], [place] [address] [phone]  If scheduled at [facility], please arrive at the [facility] main entrance of [facility] 30 minutes prior to test start time. Proceed to the [facility] Radiology Department (first floor) to check-in and test prep.  If scheduled at [facility], please arrive 15 mins early for check-in and test prep.  Please follow these instructions carefully (unless otherwise directed):    On the Night Before the Test: . Be sure to Drink plenty of water. . Do not consume any caffeinated/decaffeinated beverages or chocolate 12 hours prior to your test. . Do not take any antihistamines 12 hours prior to your test. . If the  patient has contrast allergy: ? Patient will need a prescription for Prednisone and very clear instructions (as follows): 1. Prednisone 50 mg - take 13 hours prior to test 2. Take another Prednisone 50 mg 7 hours prior to test 3. Take another Prednisone 50 mg 1 hour prior to test 4. Take Benadryl 50 mg 1 hour prior to test . Patient must complete all four doses of above prophylactic medications. . Patient will need a ride after test due to Benadryl.  On the Day of the Test: . Drink plenty of water. Do not drink any water within one hour of the test. . Do not eat any food 4 hours prior to the test. . You may take your regular medications prior to the test.  . Take metoprolol (Lopressor) two hours prior to test. . FEMALES- please wear underwire-free bra if available        After the Test: . Drink plenty of water. . After receiving IV contrast, you may experience a mild flushed feeling. This is normal. . On occasion, you may experience a mild rash up to 24 hours after the test. This is not dangerous. If this occurs, you can take Benadryl 25 mg and increase your fluid intake. . If you experience trouble breathing, this can be serious. If it is severe call 911 IMMEDIATELY. If it is mild, please call our office. . If you take any of these medications: Glipizide/Metformin, Avandament, Glucavance, please do not take 48 hours after completing test unless otherwise instructed.   Once we have confirmed authorization from your insurance company, we will call you to set up a date and time for your test. Based on how quickly your insurance processes prior authorizations requests, please allow up to 4 weeks to be contacted for scheduling your Cardiac CT appointment. Be advised that routine Cardiac  CT appointments could be scheduled as many as 8 weeks after your provider has ordered it.  For non-scheduling related questions, please contact the cardiac imaging nurse navigator should you have any  questions/concerns: Marchia Bond, Cardiac Imaging Nurse Navigator Burley Saver, Interim Cardiac Imaging Nurse [facility] Direct Office Dial: [phone]   For scheduling needs, including cancellations and rescheduling, please call Vivien Rota at [phone], option 3.      Follow-Up: At [facility], you and your health needs are our priority.  As part of our continuing mission to provide you with exceptional heart care, we have created designated Provider Care Teams.  These Care Teams include your primary Cardiologist (physician) and Advanced Practice Providers (APPs -  Physician Assistants and Nurse Practitioners) who all work together to provide you with the care you need, when you need it.  We recommend signing up for the patient portal called "MyChart".  Sign up information is provided on this After Visit Summary.  MyChart is used to connect with patients for Virtual Visits (Telemedicine).  Patients are able to view lab/test results, encounter notes, upcoming appointments, etc.  Non-urgent messages can be sent to your provider as well.   To learn more about what you can do with MyChart, go to [email or web address].    Your next appointment:   3 month(s)  The format for your next appointment:   In Person  Provider:   Berniece Salines, DO   Other Instructions   Echocardiogram An echocardiogram is a procedure that uses painless sound waves (ultrasound) to produce an image of the heart. Images from an echocardiogram can provide important information about:  Signs of coronary artery disease (CAD).  Aneurysm detection. An aneurysm is a weak or damaged part of an artery wall that bulges out from the normal force of blood pumping through the body.  Heart size and shape. Changes in the size or shape of the heart can be associated with certain conditions, including heart failure, aneurysm, and CAD.  Heart muscle function.  Heart valve  function.  Signs of a past heart attack.  Fluid buildup around the heart.  Thickening of the heart muscle.  A tumor or infectious growth around the heart valves. Tell a health care provider about:  Any allergies you have.  All medicines you are taking, including vitamins, herbs, eye drops, creams, and over-the-counter medicines.  Any blood disorders you have.  Any surgeries you have had.  Any medical conditions you have.  Whether you are pregnant or may be pregnant. What are the risks? Generally, this is a safe procedure. However, problems may occur, including:  Allergic reaction to dye (contrast) that may be used during the procedure. What happens before the procedure? No specific preparation is needed. You may eat and drink normally. What happens during the procedure?   An IV tube may be inserted into one of your veins.  You may receive contrast through this tube. A contrast is an injection that improves the quality of the pictures from your heart.  A gel will be applied to your chest.  A wand-like tool (transducer) will be moved over your chest. The gel will help to transmit the sound waves from the transducer.  The sound waves will harmlessly bounce off of your heart to allow the heart images to be captured in real-time motion. The images will be recorded on a computer. The procedure may vary among health care providers and hospitals. What happens after the procedure?  You may  return to your normal, everyday life, including diet, activities, and medicines, unless your health care provider tells you not to do that. Summary  An echocardiogram is a procedure that uses painless sound waves (ultrasound) to produce an image of the heart.  Images from an echocardiogram can provide important information about the size and shape of your heart, heart muscle function, heart valve function, and fluid buildup around your heart.  You do not need to do anything to prepare  before this procedure. You may eat and drink normally.  After the echocardiogram is completed, you may return to your normal, everyday life, unless your health care provider tells you not to do that. This information is not intended to replace advice given to you by your health care provider. Make sure you discuss any questions you have with your health care provider. Document Revised: 07/14/2018 Document Reviewed: 04/25/2016 Elsevier Patient Education  [place]. '  Cardiac CT Angiogram A cardiac CT angiogram is a procedure to look at the heart and the area around the heart. It may be done to help find the cause of chest pains or other symptoms of heart disease. During this procedure, a substance called contrast dye is injected into the blood vessels in the area to be checked. A large X-ray machine, called a CT scanner, then takes detailed pictures of the heart and the surrounding area. The procedure is also sometimes called a coronary CT angiogram, coronary artery scanning, or CTA. A cardiac CT angiogram allows the health care provider to see how well blood is flowing to and from the heart. The health care provider will be able to see if there are any problems, such as:  Blockage or narrowing of the coronary arteries in the heart.  Fluid around the heart.  Signs of weakness or disease in the muscles, valves, and tissues of the heart. Tell a health care provider about:  Any allergies you have. This is especially important if you have had a previous allergic reaction to contrast dye.  All medicines you are taking, including vitamins, herbs, eye drops, creams, and over-the-counter medicines.  Any blood disorders you have.  Any surgeries you have had.  Any medical conditions you have.  Whether you are pregnant or may be pregnant.  Any anxiety disorders, chronic pain, or other conditions you have that may increase your stress or prevent you from lying still. What are the  risks? Generally, this is a safe procedure. However, problems may occur, including:  Bleeding.  Infection.  Allergic reactions to medicines or dyes.  Damage to other structures or organs.  Kidney damage from the contrast dye that is used.  Increased risk of cancer from radiation exposure. This risk is low. Talk with your health care provider about: ? The risks and benefits of testing. ? How you can receive the lowest dose of radiation. What happens before the procedure?  Wear comfortable clothing and remove any jewelry, glasses, dentures, and hearing aids.  Follow instructions from your health care provider about eating and drinking. This may include: ? For 12 hours before the procedure -- avoid caffeine. This includes tea, coffee, soda, energy drinks, and diet pills. Drink plenty of water or other fluids that do not have caffeine in them. Being well hydrated can prevent complications. ? For 4-6 hours before the procedure -- stop eating and drinking. The contrast dye can cause nausea, but this is less likely if your stomach is empty.  Ask your health care provider about changing  or stopping your regular medicines. This is especially important if you are taking diabetes medicines, blood thinners, or medicines to treat problems with erections (erectile dysfunction). What happens during the procedure?   Hair on your chest may need to be removed so that small sticky patches called electrodes can be placed on your chest. These will transmit information that helps to monitor your heart during the procedure.  An IV will be inserted into one of your veins.  You might be given a medicine to control your heart rate during the procedure. This will help to ensure that good images are obtained.  You will be asked to lie on an exam table. This table will slide in and out of the CT machine during the procedure.  Contrast dye will be injected into the IV. You might feel warm, or you may get a  metallic taste in your mouth.  You will be given a medicine called nitroglycerin. This will relax or dilate the arteries in your heart.  The table that you are lying on will move into the CT machine tunnel for the scan.  The person running the machine will give you instructions while the scans are being done. You may be asked to: ? Keep your arms above your head. ? Hold your breath. ? Stay very still, even if the table is moving.  When the scanning is complete, you will be moved out of the machine.  The IV will be removed. The procedure may vary among health care providers and hospitals. What can I expect after the procedure? After your procedure, it is common to have:  A metallic taste in your mouth from the contrast dye.  A feeling of warmth.  A headache from the nitroglycerin. Follow these instructions at home:  Take over-the-counter and prescription medicines only as told by your health care provider.  If you are told, drink enough fluid to keep your urine pale yellow. This will help to flush the contrast dye out of your body.  Most people can return to their normal activities right after the procedure. Ask your health care provider what activities are safe for you.  It is up to you to get the results of your procedure. Ask your health care provider, or the department that is doing the procedure, when your results will be ready.  Keep all follow-up visits as told by your health care provider. This is important. Contact a health care provider if:  You have any symptoms of allergy to the contrast dye. These include: ? Shortness of breath. ? Rash or hives. ? A racing heartbeat. Summary  A cardiac CT angiogram is a procedure to look at the heart and the area around the heart. It may be done to help find the cause of chest pains or other symptoms of heart disease.  During this procedure, a large X-ray machine, called a CT scanner, takes detailed pictures of the heart and  the surrounding area after a contrast dye has been injected into blood vessels in the area.  Ask your health care provider about changing or stopping your regular medicines before the procedure. This is especially important if you are taking diabetes medicines, blood thinners, or medicines to treat erectile dysfunction.  If you are told, drink enough fluid to keep your urine pale yellow. This will help to flush the contrast dye out of your body. This information is not intended to replace advice given to you by your health care provider. Make sure you discuss  any questions you have with your health care provider. Document Revised: 11/16/2018 Document Reviewed: 11/16/2018 Elsevier Patient Education  [place].     Adopting a Healthy Lifestyle.  Know what a healthy weight is for you (roughly BMI <25) and aim to maintain this   Aim for 7+ servings of fruits and vegetables daily   65-80+ fluid ounces of water or unsweet tea for healthy kidneys   Limit to max 1 drink of alcohol per day; avoid smoking/tobacco   Limit animal fats in diet for cholesterol and heart health - choose grass fed whenever available   Avoid highly processed foods, and foods high in saturated/trans fats   Aim for low stress - take time to unwind and care for your mental health   Aim for 150 min of moderate intensity exercise weekly for heart health, and weights twice weekly for bone health   Aim for 7-9 hours of sleep daily   When it comes to diets, agreement about the perfect plan isnt easy to find, even among the experts. Experts at the [employer] developed an idea known as the Healthy Eating Plate. Just imagine a plate divided into logical, healthy portions.   The emphasis is on diet quality:   Load up on vegetables and fruits - one-half of your plate: Aim for color and variety, and remember that potatoes dont count.   Go for whole grains - one-quarter of your plate: Whole  wheat, barley, wheat berries, quinoa, oats, brown rice, and foods made with them. If you want pasta, go with whole wheat pasta.   Protein power - one-quarter of your plate: Fish, chicken, beans, and nuts are all healthy, versatile protein sources. Limit red meat.   The diet, however, does go beyond the plate, offering a few other suggestions.   Use healthy plant oils, such as olive, canola, soy, corn, sunflower and peanut. Check the labels, and avoid partially hydrogenated oil, which have unhealthy trans fats.   If youre thirsty, drink water. Coffee and tea are good in moderation, but skip sugary drinks and limit milk and dairy products to one or two daily servings.   The type of carbohydrate in the diet is more important than the amount. Some sources of carbohydrates, such as vegetables, fruits, whole grains, and beans-are healthier than others.   Finally, stay active  Signed, Berniece Salines, DO  11/01/2019 3:32 PM    [facility]

## 2019-11-01 NOTE — Patient Instructions (Signed)
Medication Instructions:  Your physician has recommended you make the following change in your medication:   Take as needed for chest pain: Nitroglycerin 0.4 mg sublingual (under your tongue) as needed for chest pain. If experiencing chest pain, stop what you are doing and sit down. Take 1 nitroglycerin and wait 5 minutes. If chest pain continues, take another nitroglycerin and wait 5 minutes. If chest pain does not subside, take 1 more nitroglycerin and dial 911. You make take a total of 3 nitroglycerin in a 15 minute time frame.   *If you need a refill on your cardiac medications before your next appointment, please call your pharmacy*   Lab Work: Your physician recommends that you return for lab work 3-7 days before ct: bmp   If you have labs (blood work) drawn today and your tests are completely normal, you will receive your results only by:  [place] (if you have MyChart) OR  A paper copy in the mail If you have any lab test that is abnormal or we need to change your treatment, we will call you to review the results.   Testing/Procedures:  Your physician has requested that you have an echocardiogram. Echocardiography is a painless test that uses sound waves to create images of your heart. It provides your doctor with information about the size and shape of your heart and how well your hearts chambers and valves are working. This procedure takes approximately one hour. There are no restrictions for this procedure.   Your cardiac CT will be scheduled at one of the below locations:   [facility] [address] [place], [place] [address] [phone]  [facility] [address] [place], [place] [address] [phone]  If scheduled at [facility], please arrive at the [facility] main entrance of [facility] 30 minutes prior to test start time. Proceed to the [facility] Radiology Department  (first floor) to check-in and test prep.  If scheduled at [facility], please arrive 15 mins early for check-in and test prep.  Please follow these instructions carefully (unless otherwise directed):    On the Night Before the Test:  Be sure to Drink plenty of water.  Do not consume any caffeinated/decaffeinated beverages or chocolate 12 hours prior to your test.  Do not take any antihistamines 12 hours prior to your test.  If the patient has contrast allergy: ? Patient will need a prescription for Prednisone and very clear instructions (as follows): 1. Prednisone 50 mg - take 13 hours prior to test 2. Take another Prednisone 50 mg 7 hours prior to test 3. Take another Prednisone 50 mg 1 hour prior to test 4. Take Benadryl 50 mg 1 hour prior to test  Patient must complete all four doses of above prophylactic medications.  Patient will need a ride after test due to Benadryl.  On the Day of the Test:  Drink plenty of water. Do not drink any water within one hour of the test.  Do not eat any food 4 hours prior to the test.  You may take your regular medications prior to the test.   Take metoprolol (Lopressor) two hours prior to test.  FEMALES- please wear underwire-free bra if available        After the Test:  Drink plenty of water.  After receiving IV contrast, you may experience a mild flushed feeling. This is normal.  On occasion, you may experience  a mild rash up to 24 hours after the test. This is not dangerous. If this occurs, you can take Benadryl 25 mg and increase your fluid intake.  If you experience trouble breathing, this can be serious. If it is severe call 911 IMMEDIATELY. If it is mild, please call our office.  If you take any of these medications: Glipizide/Metformin, Avandament, Glucavance, please do not take 48 hours after completing test unless otherwise instructed.   Once we have confirmed authorization from your  insurance company, we will call you to set up a date and time for your test. Based on how quickly your insurance processes prior authorizations requests, please allow up to 4 weeks to be contacted for scheduling your Cardiac CT appointment. Be advised that routine Cardiac CT appointments could be scheduled as many as 8 weeks after your provider has ordered it.  For non-scheduling related questions, please contact the cardiac imaging nurse navigator should you have any questions/concerns: Marchia Bond, Cardiac Imaging Nurse Navigator Burley Saver, Interim Cardiac Imaging Nurse [place] [facility] Direct Office Dial: [phone]   For scheduling needs, including cancellations and rescheduling, please call Vivien Rota at [phone], option 3.      Follow-Up: At [facility], you and your health needs are our priority.  As part of our continuing mission to provide you with exceptional heart care, we have created designated Provider Care Teams.  These Care Teams include your primary Cardiologist (physician) and Advanced Practice Providers (APPs -  Physician Assistants and Nurse Practitioners) who all work together to provide you with the care you need, when you need it.  We recommend signing up for the patient portal called "MyChart".  Sign up information is provided on this After Visit Summary.  MyChart is used to connect with patients for Virtual Visits (Telemedicine).  Patients are able to view lab/test results, encounter notes, upcoming appointments, etc.  Non-urgent messages can be sent to your provider as well.   To learn more about what you can do with MyChart, go to [email or web address].    Your next appointment:   3 month(s)  The format for your next appointment:   In Person  Provider:   Berniece Salines, DO   Other Instructions   Echocardiogram An echocardiogram is a procedure that uses painless sound waves (ultrasound) to produce an image of the heart.  Images from an echocardiogram can provide important information about:  Signs of coronary artery disease (CAD).  Aneurysm detection. An aneurysm is a weak or damaged part of an artery wall that bulges out from the normal force of blood pumping through the body.  Heart size and shape. Changes in the size or shape of the heart can be associated with certain conditions, including heart failure, aneurysm, and CAD.  Heart muscle function.  Heart valve function.  Signs of a past heart attack.  Fluid buildup around the heart.  Thickening of the heart muscle.  A tumor or infectious growth around the heart valves. Tell a health care provider about:  Any allergies you have.  All medicines you are taking, including vitamins, herbs, eye drops, creams, and over-the-counter medicines.  Any blood disorders you have.  Any surgeries you have had.  Any medical conditions you have.  Whether you are pregnant or may be pregnant. What are the risks? Generally, this is a safe procedure. However, problems may occur, including:  Allergic reaction to dye (contrast) that may be used during the procedure. What happens before the procedure? No  specific preparation is needed. You may eat and drink normally. What happens during the procedure?   An IV tube may be inserted into one of your veins.  You may receive contrast through this tube. A contrast is an injection that improves the quality of the pictures from your heart.  A gel will be applied to your chest.  A wand-like tool (transducer) will be moved over your chest. The gel will help to transmit the sound waves from the transducer.  The sound waves will harmlessly bounce off of your heart to allow the heart images to be captured in real-time motion. The images will be recorded on a computer. The procedure may vary among health care providers and hospitals. What happens after the procedure?  You may return to your normal, everyday life,  including diet, activities, and medicines, unless your health care provider tells you not to do that. Summary  An echocardiogram is a procedure that uses painless sound waves (ultrasound) to produce an image of the heart.  Images from an echocardiogram can provide important information about the size and shape of your heart, heart muscle function, heart valve function, and fluid buildup around your heart.  You do not need to do anything to prepare before this procedure. You may eat and drink normally.  After the echocardiogram is completed, you may return to your normal, everyday life, unless your health care provider tells you not to do that. This information is not intended to replace advice given to you by your health care provider. Make sure you discuss any questions you have with your health care provider. Document Revised: 07/14/2018 Document Reviewed: 04/25/2016 Elsevier Patient Education  [employer]. '  Cardiac CT Angiogram A cardiac CT angiogram is a procedure to look at the heart and the area around the heart. It may be done to help find the cause of chest pains or other symptoms of heart disease. During this procedure, a substance called contrast dye is injected into the blood vessels in the area to be checked. A large X-ray machine, called a CT scanner, then takes detailed pictures of the heart and the surrounding area. The procedure is also sometimes called a coronary CT angiogram, coronary artery scanning, or CTA. A cardiac CT angiogram allows the health care provider to see how well blood is flowing to and from the heart. The health care provider will be able to see if there are any problems, such as:  Blockage or narrowing of the coronary arteries in the heart.  Fluid around the heart.  Signs of weakness or disease in the muscles, valves, and tissues of the heart. Tell a health care provider about:  Any allergies you have. This is especially important if you have had  a previous allergic reaction to contrast dye.  All medicines you are taking, including vitamins, herbs, eye drops, creams, and over-the-counter medicines.  Any blood disorders you have.  Any surgeries you have had.  Any medical conditions you have.  Whether you are pregnant or may be pregnant.  Any anxiety disorders, chronic pain, or other conditions you have that may increase your stress or prevent you from lying still. What are the risks? Generally, this is a safe procedure. However, problems may occur, including:  Bleeding.  Infection.  Allergic reactions to medicines or dyes.  Damage to other structures or organs.  Kidney damage from the contrast dye that is used.  Increased risk of cancer from radiation exposure. This risk is low. Talk with  your health care provider about: ? The risks and benefits of testing. ? How you can receive the lowest dose of radiation. What happens before the procedure?  Wear comfortable clothing and remove any jewelry, glasses, dentures, and hearing aids.  Follow instructions from your health care provider about eating and drinking. This may include: ? For 12 hours before the procedure -- avoid caffeine. This includes tea, coffee, soda, energy drinks, and diet pills. Drink plenty of water or other fluids that do not have caffeine in them. Being well hydrated can prevent complications. ? For 4-6 hours before the procedure -- stop eating and drinking. The contrast dye can cause nausea, but this is less likely if your stomach is empty.  Ask your health care provider about changing or stopping your regular medicines. This is especially important if you are taking diabetes medicines, blood thinners, or medicines to treat problems with erections (erectile dysfunction). What happens during the procedure?   Hair on your chest may need to be removed so that small sticky patches called electrodes can be placed on your chest. These will transmit  information that helps to monitor your heart during the procedure.  An IV will be inserted into one of your veins.  You might be given a medicine to control your heart rate during the procedure. This will help to ensure that good images are obtained.  You will be asked to lie on an exam table. This table will slide in and out of the CT machine during the procedure.  Contrast dye will be injected into the IV. You might feel warm, or you may get a metallic taste in your mouth.  You will be given a medicine called nitroglycerin. This will relax or dilate the arteries in your heart.  The table that you are lying on will move into the CT machine tunnel for the scan.  The person running the machine will give you instructions while the scans are being done. You may be asked to: ? Keep your arms above your head. ? Hold your breath. ? Stay very still, even if the table is moving.  When the scanning is complete, you will be moved out of the machine.  The IV will be removed. The procedure may vary among health care providers and hospitals. What can I expect after the procedure? After your procedure, it is common to have:  A metallic taste in your mouth from the contrast dye.  A feeling of warmth.  A headache from the nitroglycerin. Follow these instructions at home:  Take over-the-counter and prescription medicines only as told by your health care provider.  If you are told, drink enough fluid to keep your urine pale yellow. This will help to flush the contrast dye out of your body.  Most people can return to their normal activities right after the procedure. Ask your health care provider what activities are safe for you.  It is up to you to get the results of your procedure. Ask your health care provider, or the department that is doing the procedure, when your results will be ready.  Keep all follow-up visits as told by your health care provider. This is important. Contact a health  care provider if:  You have any symptoms of allergy to the contrast dye. These include: ? Shortness of breath. ? Rash or hives. ? A racing heartbeat. Summary  A cardiac CT angiogram is a procedure to look at the heart and the area around the heart. It may  be done to help find the cause of chest pains or other symptoms of heart disease.  During this procedure, a large X-ray machine, called a CT scanner, takes detailed pictures of the heart and the surrounding area after a contrast dye has been injected into blood vessels in the area.  Ask your health care provider about changing or stopping your regular medicines before the procedure. This is especially important if you are taking diabetes medicines, blood thinners, or medicines to treat erectile dysfunction.  If you are told, drink enough fluid to keep your urine pale yellow. This will help to flush the contrast dye out of your body. This information is not intended to replace advice given to you by your health care provider. Make sure you discuss any questions you have with your health care provider. Document Revised: 11/16/2018 Document Reviewed: 11/16/2018 Elsevier Patient Education  [employer].

## 2019-11-21 ENCOUNTER — Ambulatory Visit (INDEPENDENT_AMBULATORY_CARE_PROVIDER_SITE_OTHER): Payer: Medicare HMO

## 2019-11-21 ENCOUNTER — Other Ambulatory Visit: Payer: Self-pay

## 2019-11-21 DIAGNOSIS — R072 Precordial pain: Secondary | ICD-10-CM | POA: Diagnosis not present

## 2019-11-21 LAB — ECHOCARDIOGRAM COMPLETE
Area-P 1/2: 4.36 cm2
S' Lateral: 2.2 cm

## 2019-11-21 NOTE — Progress Notes (Signed)
Complete echocardiogram performed.  Jimmy Lonni Dirden RDCS, RVT  

## 2019-11-22 ENCOUNTER — Telehealth: Payer: Self-pay

## 2019-11-22 NOTE — Telephone Encounter (Signed)
-----   Message from Berniece Salines, DO sent at 11/22/2019  8:22 AM EDT ----- The echo showed that the heart is not fully relaxing like it should ( diastolic dysfunction) ,but otherwise normal. I will discuss it at the next office visit.cv

## 2019-11-22 NOTE — Telephone Encounter (Signed)
Spoke with patient regarding results and recommendation.  Patient verbalizes understanding and is agreeable to plan of care. Advised patient to call back with any issues or concerns.  

## 2019-11-23 DIAGNOSIS — R072 Precordial pain: Secondary | ICD-10-CM | POA: Diagnosis not present

## 2019-11-24 ENCOUNTER — Telehealth: Payer: Self-pay

## 2019-11-24 LAB — BASIC METABOLIC PANEL
BUN/Creatinine Ratio: 13 (ref 12–28)
BUN: 9 mg/dL (ref 8–27)
CO2: 29 mmol/L (ref 20–29)
Calcium: 9.1 mg/dL (ref 8.7–10.3)
Chloride: 101 mmol/L (ref 96–106)
Creatinine, Ser: 0.72 mg/dL (ref 0.57–1.00)
GFR calc Af Amer: 99 mL/min/{1.73_m2} (ref >59)
GFR calc non Af Amer: 86 mL/min/{1.73_m2} (ref >59)
Glucose: 89 mg/dL (ref 65–99)
Potassium: 4.1 mmol/L (ref 3.5–5.2)
Sodium: 141 mmol/L (ref 134–144)

## 2019-11-24 NOTE — Telephone Encounter (Signed)
Spoke with patient regarding results and recommendation.  Patient verbalizes understanding and is agreeable to plan of care. Advised patient to call back with any issues or concerns.  

## 2019-11-24 NOTE — Telephone Encounter (Signed)
-----   Message from Berniece Salines, DO sent at 11/24/2019  8:32 AM EDT ----- Labs normal

## 2019-11-29 ENCOUNTER — Telehealth (HOSPITAL_COMMUNITY): Payer: Self-pay | Admitting: Emergency Medicine

## 2019-11-29 NOTE — Telephone Encounter (Signed)
Reaching out to patient to offer assistance regarding upcoming cardiac imaging study; pt verbalizes understanding of appt date/time, parking situation and where to check in, pre-test NPO status and medications ordered, and verified current allergies; name and call back number provided for further questions should they arise Jackie Bond RN Navigator Cardiac Imaging [facility] [phone] office [phone] cell   Pt states she is hesitant to take 100mg  metoprolol as she has a pulse ox she uses at home and her HR goes to 40s at rest. Also states her normal BP is in 90s (gets whitecoat syndrome at Dr office). I advised her that the prednisone and benadryl will subsequently increase her HR, therefore the whole dose is needed but if she felt better to take half then something is better than nothing.  Advised her to call me back if further concerns arise.

## 2019-12-01 ENCOUNTER — Ambulatory Visit (HOSPITAL_COMMUNITY)
Admission: RE | Admit: 2019-12-01 | Discharge: 2019-12-01 | Disposition: A | Discharge status: Home / Self Care | Payer: Medicare HMO | Source: Ambulatory Visit | Attending: Cardiology | Admitting: Cardiology

## 2019-12-01 ENCOUNTER — Other Ambulatory Visit: Payer: Self-pay

## 2019-12-01 DIAGNOSIS — R072 Precordial pain: Secondary | ICD-10-CM | POA: Insufficient documentation

## 2019-12-01 MED ORDER — METOPROLOL TARTRATE 5 MG/5ML IV SOLN: 10.0000 mg | Freq: Once | INTRAVENOUS | Status: AC

## 2019-12-01 MED ORDER — IOHEXOL 350 MG/ML SOLN
80.0000 mL | Freq: Once | INTRAVENOUS | Status: AC | PRN
  Administered 2019-12-01: 80 mL via INTRAVENOUS

## 2019-12-01 MED ORDER — METOPROLOL TARTRATE 5 MG/5ML IV SOLN
INTRAVENOUS | Status: AC
  Administered 2019-12-01: 5 mg via INTRAVENOUS
  Filled 2019-12-01: qty 5

## 2019-12-01 MED ORDER — DILTIAZEM HCL 25 MG/5ML IV SOLN
5.0000 mg | Freq: Once | INTRAVENOUS | Status: DC
  Filled 2019-12-01: qty 5

## 2019-12-01 MED ORDER — DIPHENHYDRAMINE HCL 50 MG/ML IJ SOLN
INTRAMUSCULAR | Status: AC
  Administered 2019-12-01: 25 mg via INTRAVENOUS
  Filled 2019-12-01: qty 1

## 2019-12-01 MED ORDER — NITROGLYCERIN 0.4 MG SL SUBL: 0.8000 mg | SUBLINGUAL_TABLET | Freq: Once | SUBLINGUAL | Status: DC

## 2019-12-01 MED ORDER — METOPROLOL TARTRATE 5 MG/5ML IV SOLN: 5.0000 mg | Freq: Once | INTRAVENOUS | Status: AC

## 2019-12-01 MED ORDER — DIPHENHYDRAMINE HCL 50 MG/ML IJ SOLN: 25.0000 mg | Freq: Once | INTRAMUSCULAR | Status: AC

## 2019-12-01 MED ORDER — NITROGLYCERIN 0.4 MG SL SUBL
SUBLINGUAL_TABLET | SUBLINGUAL | Status: AC
  Filled 2019-12-01: qty 2

## 2019-12-01 MED ORDER — DILTIAZEM HCL 25 MG/5ML IV SOLN
INTRAVENOUS | Status: AC
  Filled 2019-12-01: qty 5

## 2019-12-01 MED ORDER — METOPROLOL TARTRATE 5 MG/5ML IV SOLN
INTRAVENOUS | Status: AC
  Administered 2019-12-01: 10 mg via INTRAVENOUS
  Filled 2019-12-01: qty 10

## 2019-12-04 ENCOUNTER — Telehealth: Payer: Self-pay

## 2019-12-04 NOTE — Telephone Encounter (Signed)
Spoke with patient regarding results and recommendation.  Patient verbalizes understanding and is agreeable to plan of care. Advised patient to call back with any issues or concerns.  

## 2019-12-04 NOTE — Telephone Encounter (Signed)
-----   Message from Berniece Salines, DO sent at 12/04/2019  8:32 AM EDT ----- Your CT scan showed that you do have very minimal coronary artery disease, there is aortic atherosclerosis.  Other things that was noted on your CT that is not cardiac finding is the fact that you have a 5 mm lower lobe pulmonary nodule, small hiatal hernia.  We will also forward this to your PCP as you may need to have a repeat CT scan for your lungs in 12 months.

## 2020-01-09 DIAGNOSIS — R69 Illness, unspecified: Secondary | ICD-10-CM | POA: Diagnosis not present

## 2020-01-18 DIAGNOSIS — L821 Other seborrheic keratosis: Secondary | ICD-10-CM | POA: Diagnosis not present

## 2020-01-18 DIAGNOSIS — L820 Inflamed seborrheic keratosis: Secondary | ICD-10-CM | POA: Diagnosis not present

## 2020-01-18 DIAGNOSIS — L219 Seborrheic dermatitis, unspecified: Secondary | ICD-10-CM | POA: Diagnosis not present

## 2020-01-18 DIAGNOSIS — L578 Other skin changes due to chronic exposure to nonionizing radiation: Secondary | ICD-10-CM | POA: Diagnosis not present

## 2020-02-01 ENCOUNTER — Ambulatory Visit (INDEPENDENT_AMBULATORY_CARE_PROVIDER_SITE_OTHER): Payer: Medicare HMO | Admitting: Cardiology

## 2020-02-01 ENCOUNTER — Other Ambulatory Visit: Payer: Self-pay

## 2020-02-01 ENCOUNTER — Encounter: Payer: Self-pay | Admitting: Cardiology

## 2020-02-01 VITALS — BP 122/64 | HR 90 | Ht 67.0 in | Wt 170.0 lb

## 2020-02-01 DIAGNOSIS — I2510 Atherosclerotic heart disease of native coronary artery without angina pectoris: Secondary | ICD-10-CM

## 2020-02-01 NOTE — Patient Instructions (Signed)

## 2020-02-01 NOTE — Progress Notes (Signed)
Cardiology Office Note:    Date:  02/01/2020   ID:  Jackie Gibson, DOB 02/03/1951, MRN [id number]  PCP:  Ernestene Kiel, MD  Cardiologist:  Berniece Salines, DO  Electrophysiologist:  None   Referring MD: Ernestene Kiel, MD   " I am doing well"   History of Present Illness:    Jackie Gibson is a 69 y.o. female with a hx of coronary artery disease seen on coronary CTA, history of breast cancer, asthma.  Here today for follow-up visit. During her last visit she did have some intermittent chest pain.  Sent the patient for coronary CTA as well as echocardiogram.  She is here today for follow-up visit.  In the interim we will send the patient information about testing.  Past Medical History:  Diagnosis Date  . Asthma   . Colon polyps   . Estrogen receptor positive neoplasm 12/02/2015  . Fibrocystic breast 12/28/2016  . History of breast cancer 12/28/2016  . Hx of migraine headaches   . Malignant neoplasm of central portion of right female breast ([place]) 05/14/2015  . Mass of lower outer quadrant of left breast 01/09/2019  . Neuropathy   . Osteoarthritis   . Osteopenia   . Postoperative examination 05/14/2015  . Skin cancer   . Urticaria   . Vitamin D deficiency     Past Surgical History:  Procedure Laterality Date  . APPENDECTOMY    . CESAREAN SECTION    . CHOLECYSTECTOMY    . CORONARY ANGIOPLASTY WITH STENT PLACEMENT  1998  . MASTECTOMY, RADICAL Right     Current Medications: Current Meds  Medication Sig  . albuterol (PROVENTIL) (2.5 MG/3ML) 0.083% nebulizer solution   . nitroGLYCERIN (NITROSTAT) 0.4 MG SL tablet Place 1 tablet (0.4 mg total) under the tongue every 5 (five) minutes as needed for chest pain.  Marland Kitchen nystatin cream (MYCOSTATIN) Apply topically.     Allergies:   Heparin, Ergocalciferol, Gatifloxacin, Iodine, Penicillin g, and Sulfa antibiotics   Social History   Socioeconomic History  . Marital status: Widowed    Spouse name: Not on  file  . Number of children: Not on file  . Years of education: Not on file  . Highest education level: Not on file  Occupational History  . Not on file  Tobacco Use  . Smoking status: Current Every Day Smoker  . Smokeless tobacco: Never Used  Substance and Sexual Activity  . Alcohol use: Never  . Drug use: Never  . Sexual activity: Not on file  Other Topics Concern  . Not on file  Social History Narrative  . Not on file   Social Determinants of Health   Financial Resource Strain:   . Difficulty of Paying Living Expenses: Not on file  Food Insecurity:   . Worried About Charity fundraiser in the Last Year: Not on file  . Ran Out of Food in the Last Year: Not on file  Transportation Needs:   . Lack of Transportation (Medical): Not on file  . Lack of Transportation (Non-Medical): Not on file  Physical Activity:   . Days of Exercise per Week: Not on file  . Minutes of Exercise per Session: Not on file  Stress:   . Feeling of Stress : Not on file  Social Connections:   . Frequency of Communication with Friends and Family: Not on file  . Frequency of Social Gatherings with Friends and Family: Not on file  . Attends Religious Services: Not on  file  . Active Member of Clubs or Organizations: Not on file  . Attends Archivist Meetings: Not on file  . Marital Status: Not on file     Family History: The patient's family history includes Alzheimer's disease in her father; Stroke in her mother; Vascular Disease in her brother.  ROS:   Review of Systems  Constitution: Negative for decreased appetite, fever and weight gain.  HENT: Negative for congestion, ear discharge, hoarse voice and sore throat.   Eyes: Negative for discharge, redness, vision loss in right eye and visual halos.  Cardiovascular: Negative for chest pain, dyspnea on exertion, leg swelling, orthopnea and palpitations.  Respiratory: Negative for cough, hemoptysis, shortness of breath and snoring.     Endocrine: Negative for heat intolerance and polyphagia.  Hematologic/Lymphatic: Negative for bleeding problem. Does not bruise/bleed easily.  Skin: Negative for flushing, nail changes, rash and suspicious lesions.  Musculoskeletal: Negative for arthritis, joint pain, muscle cramps, myalgias, neck pain and stiffness.  Gastrointestinal: Negative for abdominal pain, bowel incontinence, diarrhea and excessive appetite.  Genitourinary: Negative for decreased libido, genital sores and incomplete emptying.  Neurological: Negative for brief paralysis, focal weakness, headaches and loss of balance.  Psychiatric/Behavioral: Negative for altered mental status, depression and suicidal ideas.  Allergic/Immunologic: Negative for HIV exposure and persistent infections.    EKGs/Labs/Other Studies Reviewed:    The following studies were reviewed today:   EKG: None today  Transthoracic echocardiogram IMPRESSIONS    1. Left ventricular ejection fraction, by estimation, is 60 to 65%. The  left ventricle has normal function. The left ventricle has no regional  wall motion abnormalities. There is mild left ventricular hypertrophy.  Left ventricular diastolic parameters  are consistent with Grade I diastolic dysfunction (impaired relaxation).  2. Right ventricular systolic function is normal. The right ventricular  size is normal. There is normal pulmonary artery systolic pressure.  3. The mitral valve is normal in structure. No evidence of mitral valve  regurgitation. No evidence of mitral stenosis.  4. The aortic valve is normal in structure. Aortic valve regurgitation is  not visualized. No aortic stenosis is present.  5. The inferior vena cava is normal in size with greater than 50%  respiratory variability, suggesting right atrial pressure of 3 mmHg.   Coronary CTA Other findings: Normal pulmonary vein drainage into the left atrium.  Normal left atrial appendage without a  thrombus.  Upper normal size of the pulmonary artery measuring 29 mm.  IMPRESSION: 1. Coronary calcium score of 8. This was 50 percentile for age and sex matched control.  2. Normal coronary origin with right dominance.  3. CAD-RADS 1. Minimal non-obstructive CAD (0-24%) in the ostial LAD. Consider non-atherosclerotic causes of chest pain. Consider preventive therapy and risk factor modification.   Recent Labs: 11/23/2019: BUN 9; Creatinine, Ser 0.72; Potassium 4.1; Sodium 141  Recent Lipid Panel No results found for: CHOL, TRIG, HDL, CHOLHDL, VLDL, LDLCALC, LDLDIRECT  Physical Exam:    VS:  BP 122/64   Pulse 90   Ht 5\' 7"  (1.702 m)   Wt 170 lb (77.1 kg)   SpO2 95%   BMI 26.63 kg/m     Wt Readings from Last 3 Encounters:  02/01/20 170 lb (77.1 kg)  11/01/19 169 lb (76.7 kg)  10/05/19 172 lb (78 kg)     GEN: Well nourished, well developed in no acute distress HEENT: Normal NECK: No JVD; No carotid bruits LYMPHATICS: No lymphadenopathy CARDIAC: S1S2 noted,RRR, no murmurs, rubs, gallops RESPIRATORY:  Clear to auscultation without rales, wheezing or rhonchi  ABDOMEN: Soft, non-tender, non-distended, +bowel sounds, no guarding. EXTREMITIES: No edema, No cyanosis, no clubbing MUSCULOSKELETAL:  No deformity  SKIN: Warm and dry NEUROLOGIC:  Alert and oriented x 3, non-focal PSYCHIATRIC:  Normal affect, good insight  ASSESSMENT:    1. Coronary artery disease involving native coronary artery of native heart without angina pectoris    PLAN:     I was able to discuss her testing results while she was in the office today.  All of her questions has been answered.  She has very minimal coronary artery disease.  At this time she prefers not to be on a statin.  We will continue to monitor the patient.  Her last reported of her lipid profile which was done in August 2020 showed LDL 125, total cholesterol 212.  The patient prefers nonmedicinal options she tells me.   The  patient is in agreement with the above plan. The patient left the office in stable condition.  The patient will follow up in   Medication Adjustments/Labs and Tests Ordered: Current medicines are reviewed at length with the patient today.  Concerns regarding medicines are outlined above.  No orders of the defined types were placed in this encounter.  No orders of the defined types were placed in this encounter.   Patient Instructions  Medication Instructions:  Your physician recommends that you continue on your current medications as directed. Please refer to the Current Medication list given to you today.  *If you need a refill on your cardiac medications before your next appointment, please call your pharmacy*   Lab Work: None If you have labs (blood work) drawn today and your tests are completely normal, you will receive your results only by: Marland Kitchen MyChart Message (if you have MyChart) OR . A paper copy in the mail If you have any lab test that is abnormal or we need to change your treatment, we will call you to review the results.   Testing/Procedures: None   Follow-Up: At [facility], you and your health needs are our priority.  As part of our continuing mission to provide you with exceptional heart care, we have created designated Provider Care Teams.  These Care Teams include your primary Cardiologist (physician) and Advanced Practice Providers (APPs -  Physician Assistants and Nurse Practitioners) who all work together to provide you with the care you need, when you need it.  We recommend signing up for the patient portal called "MyChart".  Sign up information is provided on this After Visit Summary.  MyChart is used to connect with patients for Virtual Visits (Telemedicine).  Patients are able to view lab/test results, encounter notes, upcoming appointments, etc.  Non-urgent messages can be sent to your provider as well.   To learn more about what you can do with MyChart, go  to [email or web address].    Your next appointment:   1 year(s)  The format for your next appointment:   In Person  Provider:   Berniece Salines, DO   Other Instructions     Adopting a Healthy Lifestyle.  Know what a healthy weight is for you (roughly BMI <25) and aim to maintain this   Aim for 7+ servings of fruits and vegetables daily   65-80+ fluid ounces of water or unsweet tea for healthy kidneys   Limit to max 1 drink of alcohol per day; avoid smoking/tobacco   Limit animal fats in diet for cholesterol and heart health -  choose grass fed whenever available   Avoid highly processed foods, and foods high in saturated/trans fats   Aim for low stress - take time to unwind and care for your mental health   Aim for 150 min of moderate intensity exercise weekly for heart health, and weights twice weekly for bone health   Aim for 7-9 hours of sleep daily   When it comes to diets, agreement about the perfect plan isnt easy to find, even among the experts. Experts at the [employer] developed an idea known as the Healthy Eating Plate. Just imagine a plate divided into logical, healthy portions.   The emphasis is on diet quality:   Load up on vegetables and fruits - one-half of your plate: Aim for color and variety, and remember that potatoes dont count.   Go for whole grains - one-quarter of your plate: Whole wheat, barley, wheat berries, quinoa, oats, brown rice, and foods made with them. If you want pasta, go with whole wheat pasta.   Protein power - one-quarter of your plate: Fish, chicken, beans, and nuts are all healthy, versatile protein sources. Limit red meat.   The diet, however, does go beyond the plate, offering a few other suggestions.   Use healthy plant oils, such as olive, canola, soy, corn, sunflower and peanut. Check the labels, and avoid partially hydrogenated oil, which have unhealthy trans fats.   If youre thirsty, drink water.  Coffee and tea are good in moderation, but skip sugary drinks and limit milk and dairy products to one or two daily servings.   The type of carbohydrate in the diet is more important than the amount. Some sources of carbohydrates, such as vegetables, fruits, whole grains, and beans-are healthier than others.   Finally, stay active  Signed, Berniece Salines, DO  02/01/2020 2:31 PM    [place]

## 2020-02-02 DIAGNOSIS — Z1339 Encounter for screening examination for other mental health and behavioral disorders: Secondary | ICD-10-CM | POA: Diagnosis not present

## 2020-02-02 DIAGNOSIS — Z853 Personal history of malignant neoplasm of breast: Secondary | ICD-10-CM | POA: Diagnosis not present

## 2020-02-02 DIAGNOSIS — R69 Illness, unspecified: Secondary | ICD-10-CM | POA: Diagnosis not present

## 2020-02-02 DIAGNOSIS — J449 Chronic obstructive pulmonary disease, unspecified: Secondary | ICD-10-CM | POA: Diagnosis not present

## 2020-02-02 DIAGNOSIS — H539 Unspecified visual disturbance: Secondary | ICD-10-CM | POA: Diagnosis not present

## 2020-02-02 DIAGNOSIS — Z0000 Encounter for general adult medical examination without abnormal findings: Secondary | ICD-10-CM | POA: Diagnosis not present

## 2020-02-02 DIAGNOSIS — H60543 Acute eczematoid otitis externa, bilateral: Secondary | ICD-10-CM | POA: Diagnosis not present

## 2020-02-02 DIAGNOSIS — Z1331 Encounter for screening for depression: Secondary | ICD-10-CM | POA: Diagnosis not present

## 2020-03-14 DIAGNOSIS — H35371 Puckering of macula, right eye: Secondary | ICD-10-CM | POA: Diagnosis not present

## 2020-03-14 DIAGNOSIS — H43811 Vitreous degeneration, right eye: Secondary | ICD-10-CM | POA: Diagnosis not present

## 2020-03-14 DIAGNOSIS — H25813 Combined forms of age-related cataract, bilateral: Secondary | ICD-10-CM | POA: Diagnosis not present

## 2020-03-26 DIAGNOSIS — J029 Acute pharyngitis, unspecified: Secondary | ICD-10-CM | POA: Diagnosis not present

## 2020-03-26 DIAGNOSIS — Z20828 Contact with and (suspected) exposure to other viral communicable diseases: Secondary | ICD-10-CM | POA: Diagnosis not present

## 2020-03-26 DIAGNOSIS — R509 Fever, unspecified: Secondary | ICD-10-CM | POA: Diagnosis not present

## 2020-03-27 DIAGNOSIS — Z1152 Encounter for screening for COVID-19: Secondary | ICD-10-CM | POA: Diagnosis not present

## 2020-03-31 DIAGNOSIS — Z880 Allergy status to penicillin: Secondary | ICD-10-CM | POA: Diagnosis not present

## 2020-03-31 DIAGNOSIS — B348 Other viral infections of unspecified site: Secondary | ICD-10-CM | POA: Diagnosis not present

## 2020-03-31 DIAGNOSIS — R69 Illness, unspecified: Secondary | ICD-10-CM | POA: Diagnosis not present

## 2020-03-31 DIAGNOSIS — Z7901 Long term (current) use of anticoagulants: Secondary | ICD-10-CM | POA: Diagnosis not present

## 2020-03-31 DIAGNOSIS — J9601 Acute respiratory failure with hypoxia: Secondary | ICD-10-CM | POA: Diagnosis not present

## 2020-03-31 DIAGNOSIS — Z7952 Long term (current) use of systemic steroids: Secondary | ICD-10-CM | POA: Diagnosis not present

## 2020-03-31 DIAGNOSIS — J449 Chronic obstructive pulmonary disease, unspecified: Secondary | ICD-10-CM | POA: Diagnosis not present

## 2020-03-31 DIAGNOSIS — B9789 Other viral agents as the cause of diseases classified elsewhere: Secondary | ICD-10-CM | POA: Diagnosis not present

## 2020-03-31 DIAGNOSIS — Z888 Allergy status to other drugs, medicaments and biological substances status: Secondary | ICD-10-CM | POA: Diagnosis not present

## 2020-03-31 DIAGNOSIS — M1990 Unspecified osteoarthritis, unspecified site: Secondary | ICD-10-CM | POA: Diagnosis not present

## 2020-03-31 DIAGNOSIS — J9602 Acute respiratory failure with hypercapnia: Secondary | ICD-10-CM | POA: Diagnosis not present

## 2020-03-31 DIAGNOSIS — J441 Chronic obstructive pulmonary disease with (acute) exacerbation: Secondary | ICD-10-CM | POA: Diagnosis not present

## 2020-03-31 DIAGNOSIS — R059 Cough, unspecified: Secondary | ICD-10-CM | POA: Diagnosis not present

## 2020-04-02 DIAGNOSIS — J449 Chronic obstructive pulmonary disease, unspecified: Secondary | ICD-10-CM | POA: Diagnosis not present

## 2020-04-09 DIAGNOSIS — B348 Other viral infections of unspecified site: Secondary | ICD-10-CM | POA: Diagnosis not present

## 2020-04-09 DIAGNOSIS — J441 Chronic obstructive pulmonary disease with (acute) exacerbation: Secondary | ICD-10-CM | POA: Diagnosis not present

## 2020-04-09 DIAGNOSIS — Z09 Encounter for follow-up examination after completed treatment for conditions other than malignant neoplasm: Secondary | ICD-10-CM | POA: Diagnosis not present

## 2020-04-09 DIAGNOSIS — Z6826 Body mass index (BMI) 26.0-26.9, adult: Secondary | ICD-10-CM | POA: Diagnosis not present

## 2020-04-09 DIAGNOSIS — K869 Disease of pancreas, unspecified: Secondary | ICD-10-CM | POA: Diagnosis not present

## 2020-04-09 DIAGNOSIS — Z1331 Encounter for screening for depression: Secondary | ICD-10-CM | POA: Diagnosis not present

## 2020-04-10 DIAGNOSIS — J441 Chronic obstructive pulmonary disease with (acute) exacerbation: Secondary | ICD-10-CM | POA: Diagnosis not present

## 2020-04-10 DIAGNOSIS — R059 Cough, unspecified: Secondary | ICD-10-CM | POA: Diagnosis not present

## 2020-04-10 DIAGNOSIS — B348 Other viral infections of unspecified site: Secondary | ICD-10-CM | POA: Diagnosis not present

## 2020-04-18 DIAGNOSIS — D72829 Elevated white blood cell count, unspecified: Secondary | ICD-10-CM | POA: Diagnosis not present

## 2020-04-25 ENCOUNTER — Telehealth: Payer: Self-pay | Admitting: Hematology and Oncology

## 2020-04-25 NOTE — Telephone Encounter (Signed)
Patient called to rescheduled 1/21 Follow Up w/Kelli to 1/25 w/Dr Bobby Rumpf.  She had conflicting Appt's for next week

## 2020-04-26 ENCOUNTER — Inpatient Hospital Stay: Payer: Medicare HMO | Admitting: Hematology and Oncology

## 2020-04-30 ENCOUNTER — Other Ambulatory Visit: Payer: Self-pay

## 2020-04-30 ENCOUNTER — Encounter: Payer: Self-pay | Admitting: Oncology

## 2020-04-30 ENCOUNTER — Inpatient Hospital Stay: Payer: Medicare HMO | Attending: Oncology | Admitting: Oncology

## 2020-04-30 ENCOUNTER — Other Ambulatory Visit: Payer: Self-pay | Admitting: Oncology

## 2020-04-30 ENCOUNTER — Telehealth: Payer: Self-pay | Admitting: Oncology

## 2020-04-30 VITALS — BP 113/56 | HR 70 | Temp 98.3°F | Resp 18 | Ht 67.0 in | Wt 169.7 lb

## 2020-04-30 DIAGNOSIS — Z170 Estrogen receptor positive status [ER+]: Secondary | ICD-10-CM

## 2020-04-30 DIAGNOSIS — C50111 Malignant neoplasm of central portion of right female breast: Secondary | ICD-10-CM

## 2020-04-30 DIAGNOSIS — C50211 Malignant neoplasm of upper-inner quadrant of right female breast: Secondary | ICD-10-CM

## 2020-04-30 NOTE — Telephone Encounter (Signed)
Per 1/25 los next appt sched appt given to patient

## 2020-04-30 NOTE — Progress Notes (Signed)
Jackie Gibson  [address] [place],  [place]  [address] [phone]  Clinic Day:  04/30/2020  Referring physician: Ernestene Kiel, MD   HISTORY OF PRESENT ILLNESS:  The patient is a 70 y.o. female with stage IIB (T2 N1a M0) hormone positive breast cancer, status post a right modified radical mastectomy in August 2012.  She did participate in the NSABP B-49 clinical trial, for which she was randomized to 6 cycles of Taxotere/Cytoxan.  However, she was only able to receive 3 cycles due to the severe toxicities this chemotherapy regimen caused.  This patient also underwent adjuvant breast radiation.  The patient took letrozole for 5+ years for her adjuvant endocrine therapy.  She comes in today for her annual follow-up.  Since her last visit, the patient has been doing well.  She denies having any particular changes with her left breast or right chest wall which concern her for disease recurrence.  Of note, she has yet to undergo her annual mammogram.   PHYSICAL EXAM:  Blood pressure (!) 113/56, pulse 70, temperature 98.3 F (36.8 C), temperature source Oral, resp. rate 18, height 5\' 7"  (1.702 m), weight 169 lb 11.2 oz (77 kg), SpO2 93 %. Wt Readings from Last 3 Encounters:  04/30/20 169 lb 11.2 oz (77 kg)  02/01/20 170 lb (77.1 kg)  11/01/19 169 lb (76.7 kg)   Body mass index is 26.58 kg/m. Performance status (ECOG): 0 Physical Exam Constitutional:      Appearance: Normal appearance.  HENT:     Mouth/Throat:     Pharynx: Oropharynx is clear. No oropharyngeal exudate.  Cardiovascular:     Rate and Rhythm: Normal rate and regular rhythm.     Heart sounds: No murmur heard. No friction rub. No gallop.   Pulmonary:     Breath sounds: Normal breath sounds.  Chest:  Breasts:     Right: Absent. No axillary adenopathy or supraclavicular adenopathy.     Left: No swelling, bleeding, inverted nipple, mass, nipple discharge, skin change, axillary  adenopathy or supraclavicular adenopathy.    Abdominal:     General: Bowel sounds are normal. There is no distension.     Palpations: Abdomen is soft. There is no mass.     Tenderness: There is no abdominal tenderness.  Musculoskeletal:        General: No tenderness.     Cervical back: Normal range of motion and neck supple.     Right lower leg: No edema.     Left lower leg: No edema.  Lymphadenopathy:     Cervical: No cervical adenopathy.     Right cervical: No superficial, deep or posterior cervical adenopathy.    Left cervical: No superficial, deep or posterior cervical adenopathy.     Upper Body:     Right upper body: No supraclavicular or axillary adenopathy.     Left upper body: No supraclavicular or axillary adenopathy.     Lower Body: No right inguinal adenopathy. No left inguinal adenopathy.  Skin:    Coloration: Skin is not jaundiced.     Findings: No lesion or rash.  Neurological:     General: No focal deficit present.     Mental Status: She is alert and oriented to person, place, and time. Mental status is at baseline.  Psychiatric:        Mood and Affect: Mood normal.        Behavior: Behavior normal.        Thought Content:  Thought content normal.        Judgment: Judgment normal.    ASSESSMENT & PLAN:  Assessment/Plan:  A 70 y.o. female with stage IIB hormone positive breast cancer, who is 9+ years out from her right mastectomy.  Based upon her clinical breast exam today, the patient remains disease-free.  Clinically, the patient is doing very well.   As she has not had her annual mammogram yet, I will arrange for it to get scheduled in the forthcoming days/weeks for her continued radiographic breast cancer surveillance.  Otherwise, I will see her back in another year for repeat clinical assessment.  The patient understands all the plans discussed today and is in agreement with them.     Jackie Stambaugh Macarthur Critchley, MD

## 2020-05-03 DIAGNOSIS — J449 Chronic obstructive pulmonary disease, unspecified: Secondary | ICD-10-CM | POA: Diagnosis not present

## 2020-05-23 DIAGNOSIS — R928 Other abnormal and inconclusive findings on diagnostic imaging of breast: Secondary | ICD-10-CM | POA: Diagnosis not present

## 2020-05-23 DIAGNOSIS — J449 Chronic obstructive pulmonary disease, unspecified: Secondary | ICD-10-CM | POA: Diagnosis not present

## 2020-05-23 DIAGNOSIS — Z853 Personal history of malignant neoplasm of breast: Secondary | ICD-10-CM | POA: Diagnosis not present

## 2020-05-23 DIAGNOSIS — H259 Unspecified age-related cataract: Secondary | ICD-10-CM | POA: Diagnosis not present

## 2020-05-23 DIAGNOSIS — K8689 Other specified diseases of pancreas: Secondary | ICD-10-CM | POA: Diagnosis not present

## 2020-05-23 DIAGNOSIS — H43819 Vitreous degeneration, unspecified eye: Secondary | ICD-10-CM | POA: Diagnosis not present

## 2020-06-03 DIAGNOSIS — J449 Chronic obstructive pulmonary disease, unspecified: Secondary | ICD-10-CM | POA: Diagnosis not present

## 2020-06-27 DIAGNOSIS — K862 Cyst of pancreas: Secondary | ICD-10-CM | POA: Diagnosis not present

## 2020-06-27 DIAGNOSIS — K8689 Other specified diseases of pancreas: Secondary | ICD-10-CM | POA: Diagnosis not present

## 2020-06-27 DIAGNOSIS — Z9049 Acquired absence of other specified parts of digestive tract: Secondary | ICD-10-CM | POA: Diagnosis not present

## 2020-08-09 DIAGNOSIS — R922 Inconclusive mammogram: Secondary | ICD-10-CM | POA: Diagnosis not present

## 2020-08-09 DIAGNOSIS — Z853 Personal history of malignant neoplasm of breast: Secondary | ICD-10-CM | POA: Diagnosis not present

## 2020-08-09 DIAGNOSIS — R928 Other abnormal and inconclusive findings on diagnostic imaging of breast: Secondary | ICD-10-CM | POA: Diagnosis not present

## 2020-08-20 DIAGNOSIS — M1710 Unilateral primary osteoarthritis, unspecified knee: Secondary | ICD-10-CM | POA: Diagnosis not present

## 2020-08-20 DIAGNOSIS — Z853 Personal history of malignant neoplasm of breast: Secondary | ICD-10-CM | POA: Diagnosis not present

## 2020-08-20 DIAGNOSIS — Z6829 Body mass index (BMI) 29.0-29.9, adult: Secondary | ICD-10-CM | POA: Diagnosis not present

## 2020-08-20 DIAGNOSIS — G629 Polyneuropathy, unspecified: Secondary | ICD-10-CM | POA: Diagnosis not present

## 2020-08-20 DIAGNOSIS — N6489 Other specified disorders of breast: Secondary | ICD-10-CM | POA: Diagnosis not present

## 2020-08-27 DIAGNOSIS — M170 Bilateral primary osteoarthritis of knee: Secondary | ICD-10-CM | POA: Diagnosis not present

## 2020-08-30 DIAGNOSIS — K436 Other and unspecified ventral hernia with obstruction, without gangrene: Secondary | ICD-10-CM | POA: Diagnosis not present

## 2020-08-30 DIAGNOSIS — R928 Other abnormal and inconclusive findings on diagnostic imaging of breast: Secondary | ICD-10-CM | POA: Diagnosis not present

## 2020-09-04 DIAGNOSIS — N6489 Other specified disorders of breast: Secondary | ICD-10-CM | POA: Diagnosis not present

## 2020-09-04 DIAGNOSIS — R928 Other abnormal and inconclusive findings on diagnostic imaging of breast: Secondary | ICD-10-CM | POA: Diagnosis not present

## 2020-09-09 IMAGING — CT CT HEART MORP W/ CTA COR W/ SCORE W/ CA W/CM &/OR W/O CM
4 of 7 series · 8 of 20 positions shown, 9 images · IV contrast (APPLIED)
Comparison: None.
COMPARISON: None.

Addendum:
EXAM:
OVER-READ INTERPRETATION  CT CHEST

The following report is an over-read performed by radiologist Dr.
Csorna Gomori [[email or web address]] on 12/01/2019. This
over-read does not include interpretation of cardiac or coronary
anatomy or pathology. The coronary calcium score/coronary CTA
interpretation by the cardiologist is attached.
CLINICAL DATA: 68-year-old female with h/o smoking, abnormal ECG,
prior chest radiation and chest pain.
Cardiac/Coronary  CTA
TECHNIQUE: The patient was scanned on a [employer] Force scanner.

[Series 6: best diast 72 % · axial · 0.39mm/px · z∈[-60,-11]mm · 2 of 372 slices shown]
[im 124/372  vessel]
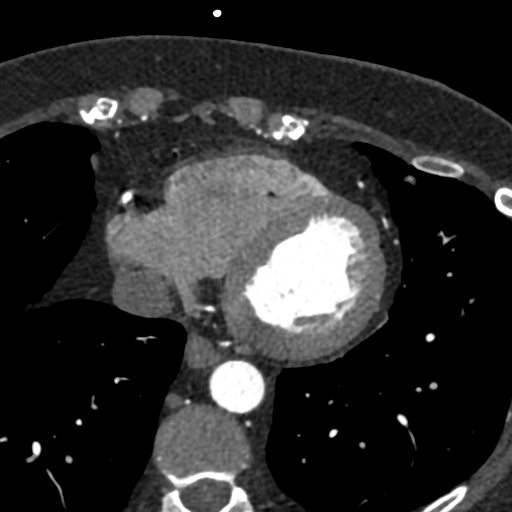
[im 248/372  vessel]
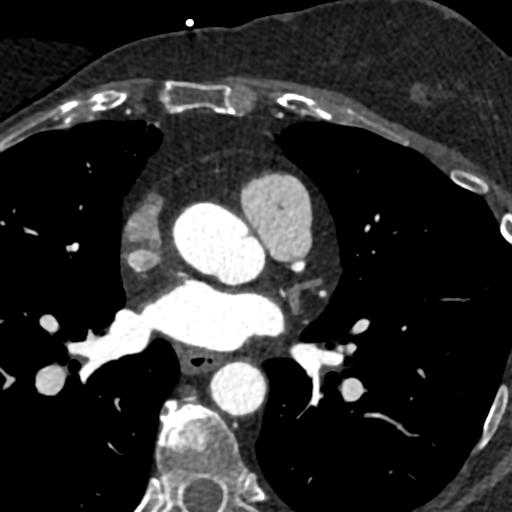

[Series 7: best syst · axial · 0.39mm/px · z∈[-60,-11]mm · 2 of 372 slices shown, 3 images]
[im 124/372  vessel]
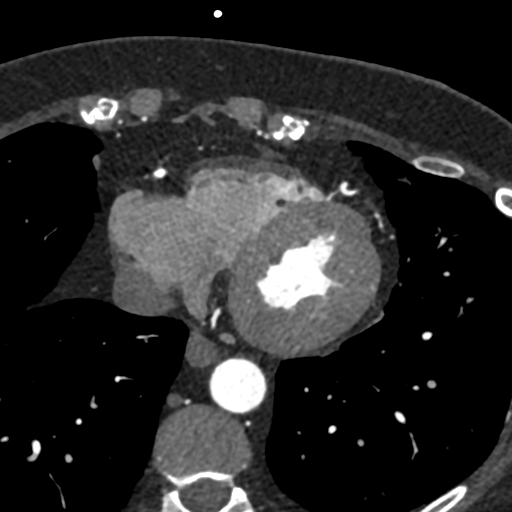
[im 124/372  lung]
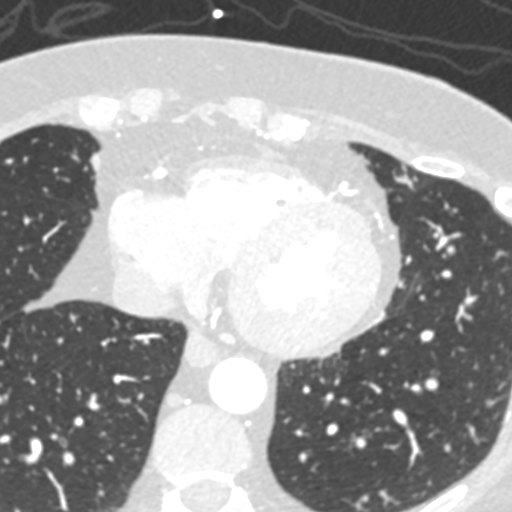
[im 248/372  vessel]
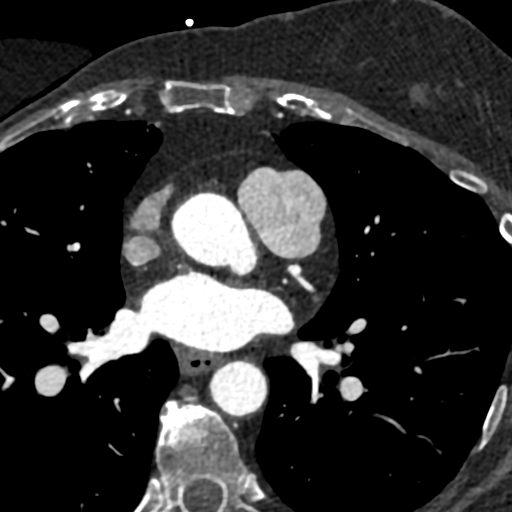

[Series 9: ts diast sharp 72 % · axial · 0.39mm/px · z∈[-60,-11]mm · 2 of 372 slices shown]
[im 124/372  lung]
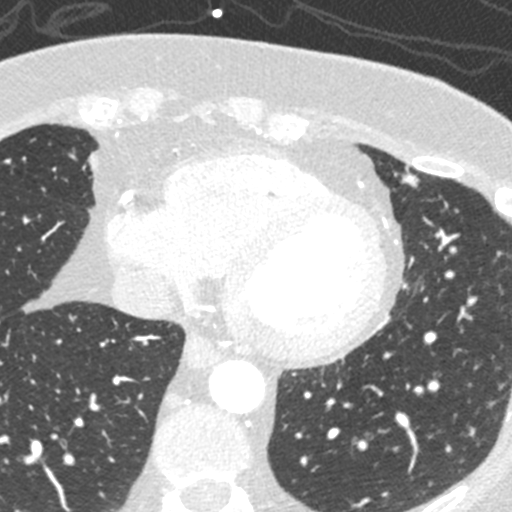
[im 248/372  lung]
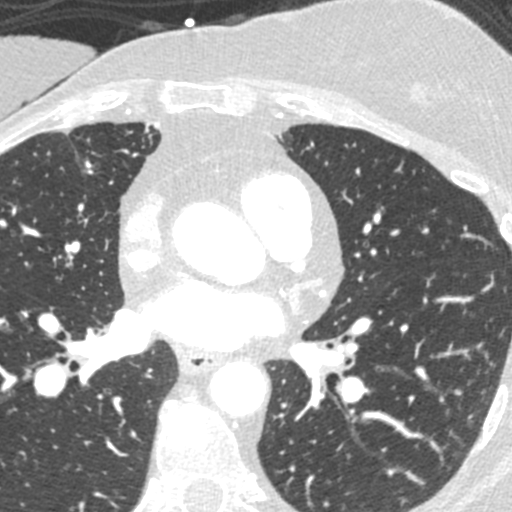

[Series 10: ts syst sharp · axial · 0.39mm/px · z∈[-60,-11]mm · 2 of 372 slices shown]
[im 124/372  lung]
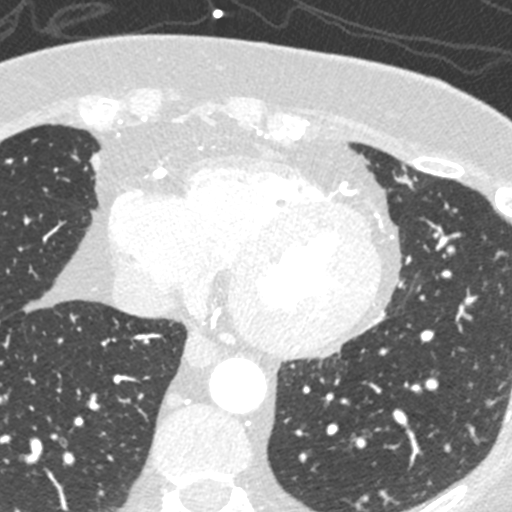
[im 248/372  lung]
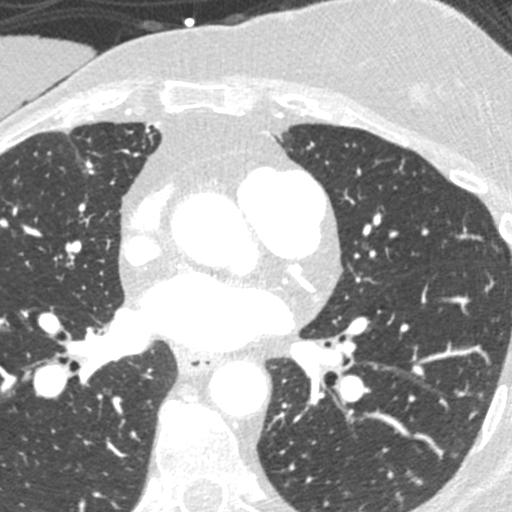

[8 of 20 positions shown; findings below may reference images not displayed]

FINDINGS: Aortic atherosclerosis. Small hiatal hernia. 5 mm left lower lobe
nodule (axial image 31 of series 12). Within the visualized portions
of the thorax there are no larger more suspicious appearing
pulmonary nodules or masses, there is no acute consolidative
airspace disease, no pleural effusions, no pneumothorax and no
lymphadenopathy. Visualized portions of the upper abdomen are
unremarkable. There are no aggressive appearing lytic or blastic
lesions noted in the visualized portions of the skeleton.
IMPRESSION: 1. 5 mm left lower lobe pulmonary nodule, nonspecific, but
statistically likely benign. No follow-up needed if patient is
low-risk. Non-contrast chest CT can be considered in 12 months if
patient is high-risk. This recommendation follows the consensus
statement: Guidelines for Management of Incidental Pulmonary Nodules
Detected on CT Images: From the [HOSPITAL] 0523; Radiology
2. Small hiatal hernia.
3.  Aortic Atherosclerosis (A6B9F-AHX.X).
FINDINGS: A 100 kV prospective scan was triggered in the descending thoracic
aorta at 111 HU's. Axial non-contrast 3 mm slices were carried out
through the heart. The data set was analyzed on a dedicated work
station and scored using the Agatson method. Gantry rotation speed
was 250 msecs and collimation was .6 mm. 100 mg of PO Metoprolol and
0.8 mg of sl NTG was given. The 3D data set was reconstructed in 5%
intervals of the 67-82 % of the R-R cycle. Diastolic phases were
analyzed on a dedicated work station using MPR, MIP and VRT modes.
The patient received 80 cc of contrast.

Aorta:  Normal size.  Trivial calcifications.  No dissection.

Aortic Valve:  Trileaflet.  No calcifications.

Coronary Arteries:  Normal coronary origin.  Right dominance.

RCA is a very large dominant artery that gives rise to large PDA and
PLA. There are only luminal irregularities.

Left main is a large artery that gives rise to LAD and LCX arteries.
Left main has no plaque

LAD is a large vessel that gives rise to a large diagonal artery.
Ostial LAD has minimal calcified plaque with stenosis 0-25%.

D1 has no plaque.

LCX is a very small non-dominant artery that gives rise to one small
OM1 branch. There is no plaque.

Other findings:

Normal pulmonary vein drainage into the left atrium.

Normal left atrial appendage without a thrombus.

Upper normal size of the pulmonary artery measuring 29 mm.
IMPRESSION: 1. Coronary calcium score of 8. This was 49 percentile for age and
sex matched control.

2. Normal coronary origin with right dominance.

3. CAD-RADS 1. Minimal non-obstructive CAD (0-24%) in the ostial
LAD. Consider non-atherosclerotic causes of chest pain. Consider
preventive therapy and risk factor modification.

*** End of Addendum ***
EXAM:
OVER-READ INTERPRETATION  CT CHEST

The following report is an over-read performed by radiologist Dr.
Csorna Gomori [[email or web address]] on 12/01/2019. This
over-read does not include interpretation of cardiac or coronary
anatomy or pathology. The coronary calcium score/coronary CTA
interpretation by the cardiologist is attached.
FINDINGS: Aortic atherosclerosis. Small hiatal hernia. 5 mm left lower lobe
nodule (axial image 31 of series 12). Within the visualized portions
of the thorax there are no larger more suspicious appearing
pulmonary nodules or masses, there is no acute consolidative
airspace disease, no pleural effusions, no pneumothorax and no
lymphadenopathy. Visualized portions of the upper abdomen are
unremarkable. There are no aggressive appearing lytic or blastic
lesions noted in the visualized portions of the skeleton.
IMPRESSION: 1. 5 mm left lower lobe pulmonary nodule, nonspecific, but
statistically likely benign. No follow-up needed if patient is
low-risk. Non-contrast chest CT can be considered in 12 months if
patient is high-risk. This recommendation follows the consensus
statement: Guidelines for Management of Incidental Pulmonary Nodules
Detected on CT Images: From the [HOSPITAL] 0523; Radiology
2. Small hiatal hernia.
3.  Aortic Atherosclerosis (A6B9F-AHX.X).

## 2020-09-16 DIAGNOSIS — N6325 Unspecified lump in the left breast, overlapping quadrants: Secondary | ICD-10-CM | POA: Diagnosis not present

## 2020-09-16 DIAGNOSIS — C50812 Malignant neoplasm of overlapping sites of left female breast: Secondary | ICD-10-CM | POA: Diagnosis not present

## 2020-09-16 DIAGNOSIS — R928 Other abnormal and inconclusive findings on diagnostic imaging of breast: Secondary | ICD-10-CM | POA: Diagnosis not present

## 2020-09-20 DIAGNOSIS — C50812 Malignant neoplasm of overlapping sites of left female breast: Secondary | ICD-10-CM | POA: Diagnosis not present

## 2020-09-20 DIAGNOSIS — Z170 Estrogen receptor positive status [ER+]: Secondary | ICD-10-CM | POA: Diagnosis not present

## 2020-10-15 DIAGNOSIS — Z170 Estrogen receptor positive status [ER+]: Secondary | ICD-10-CM | POA: Diagnosis not present

## 2020-10-15 DIAGNOSIS — E785 Hyperlipidemia, unspecified: Secondary | ICD-10-CM | POA: Diagnosis not present

## 2020-10-15 DIAGNOSIS — I700 Atherosclerosis of aorta: Secondary | ICD-10-CM | POA: Diagnosis not present

## 2020-10-15 DIAGNOSIS — R519 Headache, unspecified: Secondary | ICD-10-CM | POA: Diagnosis not present

## 2020-10-15 DIAGNOSIS — Z6830 Body mass index (BMI) 30.0-30.9, adult: Secondary | ICD-10-CM | POA: Diagnosis not present

## 2020-10-15 DIAGNOSIS — R739 Hyperglycemia, unspecified: Secondary | ICD-10-CM | POA: Diagnosis not present

## 2020-10-15 DIAGNOSIS — K219 Gastro-esophageal reflux disease without esophagitis: Secondary | ICD-10-CM | POA: Diagnosis not present

## 2020-10-15 DIAGNOSIS — C50912 Malignant neoplasm of unspecified site of left female breast: Secondary | ICD-10-CM | POA: Diagnosis not present

## 2020-10-15 DIAGNOSIS — E559 Vitamin D deficiency, unspecified: Secondary | ICD-10-CM | POA: Diagnosis not present

## 2020-10-21 DIAGNOSIS — Z6830 Body mass index (BMI) 30.0-30.9, adult: Secondary | ICD-10-CM | POA: Diagnosis not present

## 2020-10-21 DIAGNOSIS — C50812 Malignant neoplasm of overlapping sites of left female breast: Secondary | ICD-10-CM | POA: Diagnosis not present

## 2020-10-21 DIAGNOSIS — J449 Chronic obstructive pulmonary disease, unspecified: Secondary | ICD-10-CM | POA: Diagnosis not present

## 2020-10-21 DIAGNOSIS — E669 Obesity, unspecified: Secondary | ICD-10-CM | POA: Diagnosis not present

## 2020-10-21 DIAGNOSIS — Z87891 Personal history of nicotine dependence: Secondary | ICD-10-CM | POA: Diagnosis not present

## 2020-10-21 DIAGNOSIS — G629 Polyneuropathy, unspecified: Secondary | ICD-10-CM | POA: Diagnosis not present

## 2020-10-21 DIAGNOSIS — K219 Gastro-esophageal reflux disease without esophagitis: Secondary | ICD-10-CM | POA: Diagnosis not present

## 2020-10-21 DIAGNOSIS — C50412 Malignant neoplasm of upper-outer quadrant of left female breast: Secondary | ICD-10-CM | POA: Diagnosis not present

## 2020-11-01 DIAGNOSIS — E870 Hyperosmolality and hypernatremia: Secondary | ICD-10-CM | POA: Diagnosis not present

## 2020-11-05 DIAGNOSIS — C50812 Malignant neoplasm of overlapping sites of left female breast: Secondary | ICD-10-CM | POA: Diagnosis not present

## 2020-11-05 DIAGNOSIS — Z170 Estrogen receptor positive status [ER+]: Secondary | ICD-10-CM | POA: Diagnosis not present

## 2020-11-13 ENCOUNTER — Telehealth: Payer: Self-pay | Admitting: Oncology

## 2020-11-13 NOTE — Telephone Encounter (Signed)
Patient referred by Dr Orrin Brigham for Left Breast CA.  Appt made for 11/21/2020 Consult 3:30 pm

## 2020-11-20 NOTE — Progress Notes (Signed)
[facility]  [address] [place],  [place]  [address] [phone]  Clinic Day:  11/21/2020  Referring physician: Orrin Brigham MD   HISTORY OF PRESENT ILLNESS:  The patient is a 70 y.o. female  who I was asked to consult upon for newly diagnosed breast cancer.  Her history dates back to May 2022 when her annual mammogram showed a suspicious lesion in her upper left breast, which was ultimately confirmed by a breast ultrasound.  This led to a biopsy being done in June 2022, which revealed invasive ductal carcinoma.  A left breast lumpectomy was done in July 2022, which revealed a grade 2, 6 mm invasive ductal carcinoma that was estrogen and progresterone receptor positive, but Her2 receptor negative.  She comes in today to go over her breast cancer pathology and its implications.  The patient denied having any particular changes in her left breast which ever alerted her to breast cancer being present.    Of note, this patient has been followed by me for 10 years.  She was diagnosed with stage IIB (T2 N1 M0) hormone positive breast cancer back in August 2012, for which she underwent a right mastectomy.  She then participated in the NSABP B-49 clinical trial, for which she was randomized to 6 cycles of Taxotere/Cytoxan.  However, she was only able to receive 3 cycles due to the severe toxicities this chemotherapy regimen caused.  This patient also underwent adjuvant breast radiation.  The patient took letrozole for 5+ years for her adjuvant endocrine therapy.    PAST MEDICAL HISTORY:   Past Medical History:  Diagnosis Date   Asthma    Colon polyps    Estrogen receptor positive neoplasm 12/02/2015   Fibrocystic breast 12/28/2016   History of breast cancer 12/28/2016   Hx of migraine headaches    Malignant neoplasm of central portion of right female breast ([place]) 05/14/2015   Mass of lower outer quadrant of left breast 01/09/2019   Neuropathy    Osteoarthritis     Osteopenia    Postoperative examination 05/14/2015   Skin cancer    Urticaria    Vitamin D deficiency     PAST SURGICAL HISTORY:   Past Surgical History:  Procedure Laterality Date   APPENDECTOMY     CESAREAN SECTION     CHOLECYSTECTOMY     CORONARY ANGIOPLASTY WITH STENT PLACEMENT  1998   MASTECTOMY, RADICAL Right     CURRENT MEDICATIONS:   Current Outpatient Medications  Medication Sig Dispense Refill   ADVAIR HFA 115-21 MCG/ACT inhaler Inhale 2 puffs into the lungs 2 (two) times daily.     nitroGLYCERIN (NITROSTAT) 0.4 MG SL tablet Place 1 tablet (0.4 mg total) under the tongue every 5 (five) minutes as needed for chest pain. 25 tablet 11   triamcinolone (KENALOG) 0.1 % SMARTSIG:1 Application Topical 2-3 Times Daily     No current facility-administered medications for this visit.    ALLERGIES:   Allergies  Allergen Reactions   Heparin Anaphylaxis   Ergocalciferol Other (See Comments)    Migraine Headaches   Gatifloxacin     Other reaction(s): Other (See Comments) Unknown   Iodine Hives    Other reaction(s): Other (See Comments) Unknown   Penicillin G Other (See Comments)    Unknown   Sulfa Antibiotics     FAMILY HISTORY:  Sister - Melanoma 2 brothers - Prostate Cancer Father - Prostate Cancer  SOCIAL HISTORY:   reports that she has been  smoking. She has never used smokeless tobacco. She reports that she does not drink alcohol and does not use drugs.  REVIEW OF SYSTEMS:  Review of Systems  Constitutional:  Negative for fatigue and fever.  HENT:   Negative for hearing loss and sore throat.   Eyes:  Negative for eye problems.  Respiratory:  Negative for chest tightness, cough and hemoptysis.   Cardiovascular:  Negative for chest pain and palpitations.  Gastrointestinal:  Negative for abdominal distention, abdominal pain, blood in stool, constipation, diarrhea, nausea and vomiting.  Endocrine: Negative for hot flashes.  Genitourinary:  Negative for  difficulty urinating, dysuria, frequency, hematuria and nocturia.   Musculoskeletal:  Negative for arthralgias, back pain, gait problem and myalgias.  Skin: Negative.  Negative for itching and rash.  Neurological: Negative.  Negative for dizziness, extremity weakness, gait problem, headaches, light-headedness and numbness.  Hematological: Negative.   Psychiatric/Behavioral: Negative.  Negative for depression and suicidal ideas. The patient is not nervous/anxious.     PHYSICAL EXAM:  Blood pressure (!) 142/72, pulse 87, temperature 97.9 F (36.6 C), resp. rate 16, height '5\' 7"'  (1.702 m), weight 194 lb 1.6 oz (88 kg), SpO2 97 %. Wt Readings from Last 3 Encounters:  11/21/20 194 lb 1.6 oz (88 kg)  04/30/20 169 lb 11.2 oz (77 kg)  02/01/20 170 lb (77.1 kg)   Body mass index is 30.4 kg/m. Performance status (ECOG): 0 - Asymptomatic Physical Exam Constitutional:      Appearance: Normal appearance.  HENT:     Mouth/Throat:     Pharynx: Oropharynx is clear. No oropharyngeal exudate.  Cardiovascular:     Rate and Rhythm: Normal rate and regular rhythm.     Heart sounds: No murmur heard.   No friction rub. No gallop.  Pulmonary:     Breath sounds: Normal breath sounds.  Chest:  Breasts:    Right: Absent. No swelling, bleeding, inverted nipple, mass, nipple discharge or skin change.     Left: Normal. No swelling, bleeding, inverted nipple, mass, nipple discharge or skin change.     Comments: Left breast is healing well from recent lumpectomy Abdominal:     General: Bowel sounds are normal. There is no distension.     Palpations: Abdomen is soft. There is no mass.     Tenderness: There is no abdominal tenderness.  Musculoskeletal:        General: No tenderness.     Cervical back: Normal range of motion and neck supple.     Right lower leg: No edema.     Left lower leg: No edema.  Lymphadenopathy:     Cervical: No cervical adenopathy.     Right cervical: No superficial, deep or  posterior cervical adenopathy.    Left cervical: No superficial, deep or posterior cervical adenopathy.     Upper Body:     Right upper body: No supraclavicular or axillary adenopathy.     Left upper body: No supraclavicular or axillary adenopathy.     Lower Body: No right inguinal adenopathy. No left inguinal adenopathy.  Skin:    Coloration: Skin is not jaundiced.     Findings: No lesion or rash.  Neurological:     General: No focal deficit present.     Mental Status: She is alert and oriented to person, place, and time. Mental status is at baseline.  Psychiatric:        Mood and Affect: Mood normal.        Behavior: Behavior normal.  Thought Content: Thought content normal.        Judgment: Judgment normal.    ASSESSMENT & PLAN:  A 70 y.o. female who I was asked to consult upon for what appears to be stage IA (T1b N0 M0) hormone positive breast cancer.  Unlike his breast cancer from 10 years ago, her current cancer is smaller, has a lower grade, and appears not to be associated with nodal metastasis.  I will place her on anastrozole for 5 years for her adjuvant endocrine therapy.  Furthermore, I will have her evaluated by radiation oncology to determine if they believe she also needs adjuvant breast radiation.  As this patient now has a lifelong history of bilateral breast cancer and there is a family history of both prostate cancer and melanoma in 1st degree relatives, I will have her undergo genetic testing to ensure there is not a hereditary mutation present.  With respect to her disease surveillance, I will follow her with clinical breast exams every 4 months.  She will continue to undergo annual mammograms for her radiographic breast cancer surveillance.  I will see her back in 4 months for her next clinical breast exam.  The patient understands all the plans discussed today and is in agreement with them.  I do appreciate Dr Orrin Brigham for his new consult.   Oscar Forman Macarthur Critchley,  MD

## 2020-11-21 ENCOUNTER — Telehealth: Payer: Self-pay | Admitting: Oncology

## 2020-11-21 ENCOUNTER — Other Ambulatory Visit: Payer: Self-pay

## 2020-11-21 ENCOUNTER — Inpatient Hospital Stay: Payer: Medicare HMO | Attending: Oncology | Admitting: Oncology

## 2020-11-21 ENCOUNTER — Other Ambulatory Visit: Payer: Self-pay | Admitting: Oncology

## 2020-11-21 DIAGNOSIS — C50412 Malignant neoplasm of upper-outer quadrant of left female breast: Secondary | ICD-10-CM

## 2020-11-21 DIAGNOSIS — Z170 Estrogen receptor positive status [ER+]: Secondary | ICD-10-CM

## 2020-11-21 MED ORDER — ANASTROZOLE 1 MG PO TABS: 1.0000 mg | ORAL_TABLET | Freq: Every day | ORAL | 3 refills | Status: DC

## 2020-11-21 NOTE — Telephone Encounter (Signed)
Per 8/18 LOS next appt scheduled and given to patient 

## 2020-11-26 DIAGNOSIS — E785 Hyperlipidemia, unspecified: Secondary | ICD-10-CM | POA: Diagnosis not present

## 2020-11-26 DIAGNOSIS — M79671 Pain in right foot: Secondary | ICD-10-CM | POA: Diagnosis not present

## 2020-11-26 DIAGNOSIS — I700 Atherosclerosis of aorta: Secondary | ICD-10-CM | POA: Diagnosis not present

## 2020-11-26 DIAGNOSIS — C50912 Malignant neoplasm of unspecified site of left female breast: Secondary | ICD-10-CM | POA: Diagnosis not present

## 2020-11-26 DIAGNOSIS — R7303 Prediabetes: Secondary | ICD-10-CM | POA: Diagnosis not present

## 2020-12-20 DIAGNOSIS — C50812 Malignant neoplasm of overlapping sites of left female breast: Secondary | ICD-10-CM | POA: Diagnosis not present

## 2020-12-20 DIAGNOSIS — Z170 Estrogen receptor positive status [ER+]: Secondary | ICD-10-CM | POA: Diagnosis not present

## 2020-12-24 ENCOUNTER — Encounter: Payer: Self-pay | Admitting: Genetic Counselor

## 2020-12-24 ENCOUNTER — Inpatient Hospital Stay: Payer: Medicare HMO | Attending: Oncology | Admitting: Genetic Counselor

## 2020-12-24 ENCOUNTER — Other Ambulatory Visit: Payer: Self-pay | Admitting: Genetic Counselor

## 2020-12-24 ENCOUNTER — Inpatient Hospital Stay: Payer: Medicare HMO

## 2020-12-24 DIAGNOSIS — Z803 Family history of malignant neoplasm of breast: Secondary | ICD-10-CM | POA: Diagnosis not present

## 2020-12-24 DIAGNOSIS — C50412 Malignant neoplasm of upper-outer quadrant of left female breast: Secondary | ICD-10-CM

## 2020-12-24 DIAGNOSIS — Z8042 Family history of malignant neoplasm of prostate: Secondary | ICD-10-CM

## 2020-12-24 DIAGNOSIS — C4490 Unspecified malignant neoplasm of skin, unspecified: Secondary | ICD-10-CM

## 2020-12-24 DIAGNOSIS — Z853 Personal history of malignant neoplasm of breast: Secondary | ICD-10-CM

## 2020-12-24 DIAGNOSIS — C50411 Malignant neoplasm of upper-outer quadrant of right female breast: Secondary | ICD-10-CM | POA: Diagnosis not present

## 2020-12-24 DIAGNOSIS — Z170 Estrogen receptor positive status [ER+]: Secondary | ICD-10-CM

## 2020-12-24 HISTORY — DX: Family history of malignant neoplasm of breast: Z80.3

## 2020-12-24 HISTORY — DX: Family history of malignant neoplasm of prostate: Z80.42

## 2020-12-24 NOTE — Progress Notes (Signed)
REFERRING PROVIDER: Marice Potter, MD [address],  [place] [address]  PRIMARY PROVIDER:  Ernestene Kiel, MD  PRIMARY REASON FOR VISIT:  Encounter Diagnoses  Name Primary?   Malignant neoplasm of upper-outer quadrant of left breast in female, estrogen receptor positive ([place]) Yes   History of breast cancer    Family history of prostate cancer    Family history of breast cancer    Skin cancer    I connected with Jackie Gibson on 12/24/2020 at [place] EDT by [place] video conference and verified that I am speaking with the correct person using two identifiers.   Patient location: [facility] Provider location: [facility]   HISTORY OF PRESENT ILLNESS:   Jackie Gibson, a 70 y.o. female, was seen for a [facility] cancer genetics consultation at the request of Dr. Bobby Rumpf due to a personal and family history of cancer.  [name] presents to clinic today to discuss the possibility of a hereditary predisposition to cancer, to discuss genetic testing, and to further clarify her future cancer risks, as well as potential cancer risks for family members.   In June 2022, at the age of 22, Jackie Gibson was diagnosed with invasive ductal caricnoma of the left breast (ER+/PR+/HER2-).  The treatment plan included lumpectomy with plans to begin adjuvant endocrine therapy and considerations for adjuvant radiation.  In 2012, Jackie Gibson was diagnosed with right breast cancer. The treatment plan included mastectomy, three cycles of chemotherapy, adjuvant radiation, and adjuvant endocrine therapy.  Jackie Gibson also has a personal history of skin cancer, basal cell carcinomas and squamous cell carcinomas, beginning prior to her 53s.    CANCER HISTORY:  Oncology History  Breast cancer of upper-outer quadrant of left female breast ([place])  11/21/2020 Initial Diagnosis   Breast cancer of upper-outer quadrant of left female breast ([place])   11/21/2020 Cancer Staging   Staging form: Breast, AJCC 8th Edition -  Pathologic stage from 11/21/2020: Stage IA (pT1b, pN0, cM0, G2, ER+, PR+, HER2-) - Signed by Jackie Potter, MD on 11/21/2020 Histopathologic type: Infiltrating duct carcinoma, NOS Stage prefix: Initial diagnosis Multigene prognostic tests performed: None Histologic grading system: 3 grade system     RISK FACTORS:  Mammogram within the last year: yes. Colonoscopy: yes; most recent approximately 7 years ago. Ovaries intact: yes.  Hysterectomy: no.  Up to date with pelvic exams: yes. Menarche was at age 21.  First live birth at age 69.  Menopausal status: postmenopausal.  HRT use: 0 years. OCP use for approximately 20 years.  Any excessive radiation exposure in the past: no  Past Medical History:  Diagnosis Date   Asthma    Colon polyps    Estrogen receptor positive neoplasm 12/02/2015   Fibrocystic breast 12/28/2016   History of breast cancer 12/28/2016   Hx of migraine headaches    Malignant neoplasm of central portion of right female breast ([place]) 05/14/2015   Mass of lower outer quadrant of left breast 01/09/2019   Neuropathy    Osteoarthritis    Osteopenia    Postoperative examination 05/14/2015   Skin cancer    Urticaria    Vitamin D deficiency     Past Surgical History:  Procedure Laterality Date   APPENDECTOMY     CESAREAN SECTION     CHOLECYSTECTOMY     CORONARY ANGIOPLASTY WITH [place], RADICAL Right     Social History   Socioeconomic History   Marital status: Widowed  Spouse name: Not on file   Number of children: Not on file   Years of education: Not on file   Highest education level: Not on file  Occupational History   Not on file  Tobacco Use   Smoking status: Every Day   Smokeless tobacco: Never  Substance and Sexual Activity   Alcohol use: Never   Drug use: Never   Sexual activity: Not on file  Other Topics Concern   Not on file  Social History Narrative   Not on file   Social Determinants of Health    Financial Resource Strain: Not on file  Food Insecurity: Not on file  Transportation Needs: Not on file  Physical Activity: Not on file  Stress: Not on file  Social Connections: Not on file     FAMILY HISTORY:  We obtained a detailed, 4-generation family history.  Significant diagnoses are listed below: Family History  Problem Relation Age of Onset   Prostate cancer Father        dx > 59   Melanoma Sister 23       back of leg   Prostate cancer Brother        x3 brothers   Stomach cancer Maternal Uncle        dx before 100   Breast cancer Paternal Aunt 72   Skin cancer Other        three brothers and both parents, several maternal aunts/uncles     Jackie Gibson is unaware of previous family history of genetic testing for hereditary cancer risks. Patient's maternal and paternal ancestors are of [place], [place], and [place] descent. There is no reported Ashkenazi Jewish ancestry. There is no known consanguinity.  GENETIC COUNSELING ASSESSMENT: Jackie Gibson is a 70 y.o. female with a personal and family history of cancer which is somewhat suggestive of a hereditary cancer syndrome and predisposition to cancer given her history of two primary breast cancers and the presence of related cancers in her family. We, therefore, discussed and recommended the following at today's visit.   DISCUSSION: We discussed that 5 - 10% of cancer is hereditary, with most cases of hereditary breast cancer associated with mutations in BRCA1/2.  There are other genes that can be associated with hereditary breast cancer syndromes.  Type of cancer risk and level of risk are gene-specific.  We discussed that testing is beneficial for several reasons including knowing how to follow individuals after completing their treatment, identifying whether potential treatment options would be beneficial, and understanding if other family members could be at risk for cancer and allowing them to undergo genetic testing.   We  reviewed the characteristics, features and inheritance patterns of hereditary cancer syndromes. We also discussed genetic testing, including the appropriate family members to test, the process of testing, insurance coverage and turn-around-time for results. We discussed the implications of a negative, positive, carrier and/or variant of uncertain significant result. We recommended Jackie Gibson pursue genetic testing for a panel that includes genes associated with breast cancer, prostate cancer, and melanoma.    The Multi-Cancer + RNA Panel offered by [facility] includes sequencing and/or deletion/duplication analysis of the following 84 genes:  AIP*, ALK, APC*, ATM*, AXIN2*, BAP1*, BARD1*, BLM*, BMPR1A*, BRCA1*, BRCA2*, BRIP1*, CASR, CDC73*, CDH1*, CDK4, CDKN1B*, CDKN1C*, CDKN2A, CEBPA, CHEK2*, CTNNA1*, DICER1*, DIS3L2*, EGFR, EPCAM, FH*, FLCN*, GATA2*, GPC3, GREM1, HOXB13, HRAS, KIT, MAX*, MEN1*, MET, MITF, MLH1*, MSH2*, MSH3*, MSH6*, MUTYH*, NBN*, NF1*, NF2*, NTHL1*, PALB2*, PDGFRA, PHOX2B, PMS2*, POLD1*, POLE*, POT1*, PRKAR1A*, PTCH1*, PTEN*,  RAD50*, RAD51C*, RAD51D*, RB1*, RECQL4, RET, RUNX1*, SDHA*, SDHAF2*, SDHB*, SDHC*, SDHD*, SMAD4*, SMARCA4*, SMARCB1*, SMARCE1*, STK11*, SUFU*, TERC, TERT, TMEM127*, Tp53*, TSC1*, TSC2*, VHL*, WRN*, and WT1.  RNA analysis is performed for * genes.  Based on Jackie Gibson's personal and family history of cancer, she meets medical criteria for genetic testing. Despite that she meets criteria, she may still have an out of pocket cost. We discussed that if she has an out of pocket cost for testing, the laboratory should reach out to her to discuss self-pay options and patient pay assistance programs.   PLAN: After considering the risks, benefits, and limitations, Jackie Gibson provided informed consent to pursue genetic testing and the blood sample was sent to [facility] for analysis of the Multi-Cancer +RNA Panel. Results should be available within approximately 3 weeks'  time, at which point they will be disclosed by telephone to Jackie Gibson, as will any additional recommendations warranted by these results. Jackie Gibson will receive a summary of her genetic counseling visit and a copy of her results once available. This information will also be available in Epic.   Lastly, we encouraged Jackie Gibson to remain in contact with cancer genetics annually so that we can continuously update the family history and inform her of any changes in cancer genetics and testing that may be of benefit for this family.   Jackie Gibson questions were answered to her satisfaction today. Our contact information was provided should additional questions or concerns arise. Thank you for the referral and allowing [place] to share in the care of your patient.   Twilia Yaklin M. Joette Catching, [place], [facility] Genetic Counselor [email or web address] (P) [phone]  The patient was seen for a total of 30 minutes in face-to-face genetic counseling.  The patient was seen alone.  Drs. Magrinat, Lindi Adie and/or Burr Medico were available to discuss this case as needed.    _______________________________________________________________________ For Office Staff:  Number of people involved in session: 1 Was an Intern/ student involved with case: no

## 2021-01-08 ENCOUNTER — Encounter: Payer: Self-pay | Admitting: Genetic Counselor

## 2021-01-08 ENCOUNTER — Telehealth: Payer: Self-pay | Admitting: Genetic Counselor

## 2021-01-08 DIAGNOSIS — Z1509 Genetic susceptibility to other malignant neoplasm: Secondary | ICD-10-CM | POA: Insufficient documentation

## 2021-01-08 DIAGNOSIS — Z1379 Encounter for other screening for genetic and chromosomal anomalies: Secondary | ICD-10-CM | POA: Insufficient documentation

## 2021-01-08 DIAGNOSIS — Z1501 Genetic susceptibility to malignant neoplasm of breast: Secondary | ICD-10-CM

## 2021-01-08 HISTORY — DX: Genetic susceptibility to other malignant neoplasm: Z15.09

## 2021-01-08 HISTORY — DX: Genetic susceptibility to malignant neoplasm of breast: Z15.01

## 2021-01-08 NOTE — Telephone Encounter (Signed)
Revealed pathogenic variant in ATM gene.  Briefly discussed cancer risks, managemnet, and implications to family members.  Follow-up appointment scheduled 10/13 at 2pm to discuss results in more detail.

## 2021-01-16 ENCOUNTER — Inpatient Hospital Stay: Payer: Medicare HMO | Attending: Oncology | Admitting: Genetic Counselor

## 2021-01-16 DIAGNOSIS — C50412 Malignant neoplasm of upper-outer quadrant of left female breast: Secondary | ICD-10-CM

## 2021-01-16 DIAGNOSIS — Z8042 Family history of malignant neoplasm of prostate: Secondary | ICD-10-CM

## 2021-01-16 DIAGNOSIS — Z803 Family history of malignant neoplasm of breast: Secondary | ICD-10-CM

## 2021-01-16 DIAGNOSIS — Z1379 Encounter for other screening for genetic and chromosomal anomalies: Secondary | ICD-10-CM

## 2021-01-16 DIAGNOSIS — Z170 Estrogen receptor positive status [ER+]: Secondary | ICD-10-CM

## 2021-01-16 DIAGNOSIS — Z1501 Genetic susceptibility to malignant neoplasm of breast: Secondary | ICD-10-CM

## 2021-01-21 ENCOUNTER — Encounter: Payer: Self-pay | Admitting: Genetic Counselor

## 2021-01-21 NOTE — Progress Notes (Signed)
GENETIC TEST RESULTS  Patient Name: Jackie Gibson Patient Age: 70 y.o. Encounter Date: 01/16/2021  Referring Provider: Marice Potter, MD   Ms. Goguen was seen in the [facility] clinic on December 24, 2020 due to a personal and family history of cancer and concern regarding a hereditary predisposition to cancer in the family. Please refer to the prior Genetics clinic note for more information regarding Ms. Arnett's medical and family histories and our assessment at the time.   FAMILY HISTORY:  We obtained a detailed, 4-generation family history.  Significant diagnoses are listed below: Family History  Problem Relation Age of Onset   Prostate cancer Father        dx > 62   Melanoma Sister 54       back of leg   Prostate cancer Brother        x3 brothers   Stomach cancer Maternal Uncle        dx before 90   Breast cancer Paternal Aunt 95   Skin cancer Other        three brothers and both parents, several maternal aunts/uncles      Ms. Klosowski is unaware of previous family history of genetic testing for hereditary cancer risks. Patient's maternal and paternal ancestors are of [place], [place], and [place] descent. There is no reported Ashkenazi Jewish ancestry. There is no known consanguinity.   GENETIC TESTING:  At the time of Ms. Kirshner's visit, we recommended she pursue genetic testing of the [facility] Multi-Cancer +RNA Panel. The genetic testing, which reported on January 08, 2021, identified a single, heterozygous pathogenic gene mutation called  ATM [id number] ([id number]). There were no deleterious mutations or variants of uncertain significance detected in the other genes on the panel.  The Multi-Cancer + RNA Panel offered by [facility] includes sequencing and/or deletion/duplication analysis of the following 84 genes:  AIP*, ALK, APC*, ATM*, AXIN2*, BAP1*, BARD1*, BLM*, BMPR1A*, BRCA1*, BRCA2*, BRIP1*, CASR, CDC73*, CDH1*, CDK4, CDKN1B*, CDKN1C*, CDKN2A,  CEBPA, CHEK2*, CTNNA1*, DICER1*, DIS3L2*, EGFR, EPCAM, FH*, FLCN*, GATA2*, GPC3, GREM1, HOXB13, HRAS, KIT, MAX*, MEN1*, MET, MITF, MLH1*, MSH2*, MSH3*, MSH6*, MUTYH*, NBN*, NF1*, NF2*, NTHL1*, PALB2*, PDGFRA, PHOX2B, PMS2*, POLD1*, POLE*, POT1*, PRKAR1A*, PTCH1*, PTEN*, RAD50*, RAD51C*, RAD51D*, RB1*, RECQL4, RET, RUNX1*, SDHA*, SDHAF2*, SDHB*, SDHC*, SDHD*, SMAD4*, SMARCA4*, SMARCB1*, SMARCE1*, STK11*, SUFU*, TERC, TERT, TMEM127*, Tp53*, TSC1*, TSC2*, VHL*, WRN*, and WT1.  RNA analysis is performed for * genes.     DISCUSSION: The ATM gene is involved in the detection and surveillance of DNA damage.  ATM phosphorylation of BRCA1 is critical for proper response to DNA double-strand breaks.  This is believed to be the reason for the role ATM has in breast cancer risk.  There has been some evidence that radiation treatment may increase the risk for breast cancer in the contralateral breast. Despite this risk, we do not recommend declining radiation treatment for her breast cancer if it is recommended, as the risk for having a recurrence of breast cancer based on not going through radiation may be greater than her risk for getting breast cancer again from the radiation.   Management for individuals with ATM mutations can be found in the NCCN guidelines (v.1.2023).  These guidelines recommend the following:  Breast Cancer Absolute risk: 20%-40%, compared to general population risk of approximately 12.8% Screening:  Annual mammogram at age 58 y and consider breast MRI with contrast starting at age 25-35 Risk Reducing Mastectomy: Evidence insufficient, consider based on family history  Ovarian Cancer  Absolute risk: <3%, compared to general population risk of approximately 1.3% Risk Reducing Salpingo-oophorectomy: Evidence insufficient, manage based on family history   Pancreatic Cancer Absolute risk: ~5-10%, compared to general population risk of approximately 1.6% Pancreatic screening for mutation  carriers with a family history of pancreatic cancer   Other Cancer Risks Unknown or insufficient evidence for prostate cancer in males  FAMILY MEMBERS: It is important that all of Ms. Beaver's relatives (both males and females) know of the presence of this gene mutation. Site-specific genetic testing can sort out who in the family is at risk and who is not.   Ms. Deloach children and siblings have a 50% chance to have inherited this mutation. We recommend they have genetic testing for this same mutation, as identifying the presence of this mutation would allow them to also take advantage of risk-reducing measures.   Individuals with a ATM mutation are at a greater risk for having children with Ataxia-telangiectasia (A-T).  AT is characterized by progressive cerebellar degeneration (ataxia), dilated blood vessels in the eyes and skin (telangiectasia), immunodeficiency, chromosomal instability, increased sensitivity to ionizing radiation and a predisposition to lymphoma and leukemia.  Therefore, individuals of childbearing age who have a known ATM mutation may want to consider having their reproductive partner tested to determine their risk for having a child with A-T.  SUPPORT AND RESOURCES: If Ms. Detore is interested in ATM-specific information and support, there are two groups, Facing Our Risk (www.facingourrisk.com) and Bright Pink (www.brightpink.org) which some people have found useful. They provide opportunities to speak with other individuals from high-risk families. To locate genetic counselors in other cities, visit the website of the [employer] (ArtistMovie.se) and Secretary/administrator for a Social worker by zip code.  We encouraged Ms. Borchardt to remain in contact with [place] on an annual basis so we can update her personal and family histories, and let her know of advances in cancer genetics that may benefit the family. Our contact number was provided. Ms. Sarria questions were  answered to her satisfaction today, and she knows she is welcome to call anytime with additional questions.   Cari M. Joette Catching, [place], [facility] Genetic Counselor [email or web address] (P) [phone]  The patient was seen for a total of 30 minutes in audio-only genetic counseling.

## 2021-02-05 DIAGNOSIS — R7303 Prediabetes: Secondary | ICD-10-CM | POA: Diagnosis not present

## 2021-02-05 DIAGNOSIS — J449 Chronic obstructive pulmonary disease, unspecified: Secondary | ICD-10-CM | POA: Diagnosis not present

## 2021-02-05 DIAGNOSIS — E785 Hyperlipidemia, unspecified: Secondary | ICD-10-CM | POA: Diagnosis not present

## 2021-02-05 DIAGNOSIS — Z1331 Encounter for screening for depression: Secondary | ICD-10-CM | POA: Diagnosis not present

## 2021-02-05 DIAGNOSIS — M79671 Pain in right foot: Secondary | ICD-10-CM | POA: Diagnosis not present

## 2021-02-05 DIAGNOSIS — Z170 Estrogen receptor positive status [ER+]: Secondary | ICD-10-CM | POA: Diagnosis not present

## 2021-02-05 DIAGNOSIS — Z0000 Encounter for general adult medical examination without abnormal findings: Secondary | ICD-10-CM | POA: Diagnosis not present

## 2021-02-05 DIAGNOSIS — C50912 Malignant neoplasm of unspecified site of left female breast: Secondary | ICD-10-CM | POA: Diagnosis not present

## 2021-02-05 DIAGNOSIS — Z79899 Other long term (current) drug therapy: Secondary | ICD-10-CM | POA: Diagnosis not present

## 2021-02-13 ENCOUNTER — Other Ambulatory Visit: Payer: Self-pay

## 2021-02-13 ENCOUNTER — Encounter: Payer: Self-pay | Admitting: Podiatry

## 2021-02-13 ENCOUNTER — Ambulatory Visit (INDEPENDENT_AMBULATORY_CARE_PROVIDER_SITE_OTHER): Payer: Medicare HMO

## 2021-02-13 ENCOUNTER — Ambulatory Visit: Payer: Medicare HMO | Admitting: Podiatry

## 2021-02-13 DIAGNOSIS — M216X9 Other acquired deformities of unspecified foot: Secondary | ICD-10-CM | POA: Diagnosis not present

## 2021-02-13 DIAGNOSIS — M7731 Calcaneal spur, right foot: Secondary | ICD-10-CM | POA: Diagnosis not present

## 2021-02-13 DIAGNOSIS — M79671 Pain in right foot: Secondary | ICD-10-CM | POA: Diagnosis not present

## 2021-02-13 DIAGNOSIS — G8929 Other chronic pain: Secondary | ICD-10-CM

## 2021-02-13 DIAGNOSIS — M722 Plantar fascial fibromatosis: Secondary | ICD-10-CM

## 2021-02-13 MED ORDER — BETAMETHASONE SOD PHOS & ACET 6 (3-3) MG/ML IJ SUSP
6.0000 mg | Freq: Once | INTRAMUSCULAR | Status: AC
  Administered 2021-02-13: 6 mg

## 2021-02-13 NOTE — Progress Notes (Signed)
  Subjective:  Patient ID: Jackie Gibson, female    DOB: 05/14/1950,  MRN: [id number]  Chief Complaint  Patient presents with   Plantar Fasciitis    I am limping on the right heel and I have had breast cancer   70 y.o. female presents with the above complaint. History confirmed with patient. States that the heel hurts most in the AM. Was told it was plantar fasciitis by her PCP.  Has difficulty putting weight on it and states the pain radiates up to the ankle.  Is limping due to the pain.  Objective:  Physical Exam: warm, good capillary refill, no trophic changes or ulcerative lesions, normal DP and PT pulses, and normal sensory exam Right Foot: tenderness to palpation medial calcaneal tuber, no pain with calcaneal squeeze, decreased ankle joint ROM, and +Silverskiold test normal exam, no swelling, tenderness, instability; ligaments intact, full range of motion of all ankle/foot joints other than findings noted above.  Radiographs: X-ray of the right foot: no evidence of calcaneal stress fracture, plantar calcaneal spur, posterior calcaneal spur, and Haglund deformity noted  Assessment:   1. Plantar fasciitis of right foot   2. Equinus deformity of foot   3. Calcaneal spur of right foot    Plan:  Patient was evaluated and treated and all questions answered.  Plantar Fasciitis -XR reviewed with patient -Educated patient on stretching and icing of the affected limb -Night splint dispensed -Injection delivered to the plantar fascia of the right foot.  Procedure: Injection Tendon/Ligament Consent: Verbal consent obtained. Location: Right plantar fascia at the glabrous junction; medial approach. Skin Prep: Alcohol. Injectate: 1 cc 0.5% marcaine plain, 1 cc betamethasone acetate-betamethasone sodium phosphate Disposition: Patient tolerated procedure well. Injection site dressed with a band-aid.  Return in about 4 weeks (around 03/13/2021) for Plantar fasciitis.

## 2021-02-13 NOTE — Patient Instructions (Signed)

## 2021-02-17 ENCOUNTER — Ambulatory Visit: Payer: Medicare HMO | Admitting: Podiatry

## 2021-03-14 NOTE — Progress Notes (Signed)
[facility]  [address] [place],  [place]  [address] [phone]  Clinic Day:  03/24/2021  Referring physician: Orrin Brigham MD  This document serves as a record of services personally performed by Marice Potter, MD. It was created on their behalf by [facility], a trained medical scribe. The creation of this record is based on the scribe's personal observations and the provider's statements to them.  HISTORY OF PRESENT ILLNESS:  The patient is a 70 y.o. female with stage IA (T1b N0 M0) hormone positive breast cancer, status post a left breast lumpectomy in July 2022.  She is currently taking anastrozole for adjuvant endocrine therapy.  She comes in today for routine follow-up.  Since her last visit, the patient has been doing well.  She denies having any particular changes in her left breast which concern her for early disease recurrence.  Her only concern today is increased shortness of breath, which she attributes to weight gain.  Of note, this patient has been followed by me for over 10 years.  She was diagnosed with stage IIB (T2 N1 M0) hormone positive breast cancer back in August 2012, for which she underwent a right mastectomy.  She then participated in the NSABP B-49 clinical trial, for which she was randomized to 6 cycles of Taxotere/Cytoxan.  However, she was only able to receive 3 cycles due to the severe toxicities this chemotherapy regimen caused.  This patient also underwent adjuvant breast radiation.  The patient took letrozole for 5+ years for her adjuvant endocrine therapy.    PHYSICAL EXAM:  Blood pressure (!) 144/74, pulse 96, temperature 98.5 F (36.9 C), resp. rate 16, height 5\' 7"  (1.702 m), weight 195 lb 8 oz (88.7 kg), SpO2 96 %. Wt Readings from Last 3 Encounters:  03/24/21 195 lb 8 oz (88.7 kg)  11/21/20 194 lb 1.6 oz (88 kg)  04/30/20 169 lb 11.2 oz (77 kg)   Body mass index is 30.62 kg/m. Performance status (ECOG): 0  - Asymptomatic Physical Exam Constitutional:      Appearance: Normal appearance.  HENT:     Mouth/Throat:     Pharynx: Oropharynx is clear. No oropharyngeal exudate.  Cardiovascular:     Rate and Rhythm: Normal rate and regular rhythm.     Heart sounds: No murmur heard.   No friction rub. No gallop.  Pulmonary:     Breath sounds: Normal breath sounds.  Chest:  Breasts:    Right: Absent. No swelling, bleeding, inverted nipple, mass, nipple discharge or skin change.     Left: Normal. No swelling, bleeding, inverted nipple, mass, nipple discharge or skin change.  Abdominal:     General: Bowel sounds are normal. There is no distension.     Palpations: Abdomen is soft. There is no mass.     Tenderness: There is no abdominal tenderness.  Musculoskeletal:        General: No tenderness.     Cervical back: Normal range of motion and neck supple.     Right lower leg: No edema.     Left lower leg: No edema.  Lymphadenopathy:     Cervical: No cervical adenopathy.     Right cervical: No superficial, deep or posterior cervical adenopathy.    Left cervical: No superficial, deep or posterior cervical adenopathy.     Upper Body:     Right upper body: No supraclavicular or axillary adenopathy.     Left upper body: No supraclavicular or axillary  adenopathy.     Lower Body: No right inguinal adenopathy. No left inguinal adenopathy.  Skin:    Coloration: Skin is not jaundiced.     Findings: No lesion or rash.  Neurological:     General: No focal deficit present.     Mental Status: She is alert and oriented to person, place, and time. Mental status is at baseline.  Psychiatric:        Mood and Affect: Mood normal.        Behavior: Behavior normal.        Thought Content: Thought content normal.        Judgment: Judgment normal.    ASSESSMENT & PLAN:  A 70 y.o. female with stage IA (T1b N0 M0) hormone positive breast cancer, status post a left breast lumpectomy in July 2022.  Based upon her  clinical breast on today, the patient remains disease-free.  She knows to continue her anastrozole daily for her 5 years of adjuvant endocrine therapy.  With respect to her disease surveillance, I will see her back in 4 months for her next clinical breast exam.  The patient understands all the plans discussed today and is in agreement with them.  I, Rita Ohara, am acting as scribe for Marice Potter, MD    I have reviewed this report as typed by the medical scribe, and it is complete and accurate.  Sibley Rolison Macarthur Critchley, MD

## 2021-03-17 ENCOUNTER — Encounter: Payer: Self-pay | Admitting: Podiatry

## 2021-03-17 ENCOUNTER — Ambulatory Visit: Payer: Medicare HMO | Admitting: Podiatry

## 2021-03-17 DIAGNOSIS — M722 Plantar fascial fibromatosis: Secondary | ICD-10-CM | POA: Diagnosis not present

## 2021-03-17 MED ORDER — BETAMETHASONE SOD PHOS & ACET 6 (3-3) MG/ML IJ SUSP
6.0000 mg | Freq: Once | INTRAMUSCULAR | Status: AC
  Administered 2021-03-17: 6 mg

## 2021-03-17 NOTE — Progress Notes (Signed)
  Subjective:  Patient ID: Jackie Gibson, female    DOB: Aug 18, 1950,  MRN: [id number]  Chief Complaint  Patient presents with   Plantar Fasciitis    It is about the same on the right heel and hurts on the outside of the heel and the boot helped a little in the am and the shot lasted 12 hours    70 y.o. female presents with the above complaint. History confirmed with patient. States the injection helped for a little bit but still having morning pain and limping. Objective:  Physical Exam: warm, good capillary refill, no trophic changes or ulcerative lesions, normal DP and PT pulses, and normal sensory exam Right Foot: tenderness to palpation medial calcaneal tuber, no pain with calcaneal squeeze, decreased ankle joint ROM, and +Silverskiold test normal exam, no swelling, tenderness, instability; ligaments intact, full range of motion of all ankle/foot joints other than findings noted above.   Assessment:   1. Plantar fasciitis of right foot    Plan:  Patient was evaluated and treated and all questions answered.  Plantar Fasciitis -Repeat injection as below -Dispense PF brace to rest the area -Continue stretching and icing.   Procedure: Injection Tendon/Ligament Consent: Verbal consent obtained. Location: Right plantar fascia at the glabrous junction; medial approach. Skin Prep: Alcohol. Injectate: 1 cc 0.5% marcaine plain, 1 cc betamethasone acetate-betamethasone sodium phosphate Disposition: Patient tolerated procedure well. Injection site dressed with a band-aid.    No follow-ups on file.

## 2021-03-24 ENCOUNTER — Telehealth: Payer: Self-pay | Admitting: Oncology

## 2021-03-24 ENCOUNTER — Encounter: Payer: Self-pay | Admitting: Oncology

## 2021-03-24 ENCOUNTER — Inpatient Hospital Stay: Payer: Medicare HMO | Attending: Oncology | Admitting: Oncology

## 2021-03-24 VITALS — BP 144/74 | HR 96 | Temp 98.5°F | Resp 16 | Ht 67.0 in | Wt 195.5 lb

## 2021-03-24 DIAGNOSIS — R0602 Shortness of breath: Secondary | ICD-10-CM

## 2021-03-24 NOTE — Telephone Encounter (Signed)
Per 12/19 los next appt scheduled and given to patient °

## 2021-04-06 DIAGNOSIS — R0989 Other specified symptoms and signs involving the circulatory and respiratory systems: Secondary | ICD-10-CM | POA: Diagnosis not present

## 2021-04-16 ENCOUNTER — Ambulatory Visit (INDEPENDENT_AMBULATORY_CARE_PROVIDER_SITE_OTHER): Payer: Medicare HMO | Admitting: Podiatry

## 2021-04-16 ENCOUNTER — Encounter: Payer: Self-pay | Admitting: Podiatry

## 2021-04-16 ENCOUNTER — Other Ambulatory Visit: Payer: Self-pay

## 2021-04-16 DIAGNOSIS — M722 Plantar fascial fibromatosis: Secondary | ICD-10-CM

## 2021-04-16 MED ORDER — BETAMETHASONE SOD PHOS & ACET 6 (3-3) MG/ML IJ SUSP
6.0000 mg | Freq: Once | INTRAMUSCULAR | Status: AC
  Administered 2021-04-16: 6 mg

## 2021-04-16 NOTE — Progress Notes (Signed)
°  Subjective:  Patient ID: Jackie Gibson, female    DOB: 04-17-1950,  MRN: [id number]  Chief Complaint  Patient presents with   Plantar Fasciitis    The right heel is a little better and the brace seems to help and some swelling in the ankles and I had pneumonia a while back   71 y.o. female presents with the above complaint. History confirmed with patient.  Objective:  Physical Exam: warm, good capillary refill, no trophic changes or ulcerative lesions, normal DP and PT pulses, and normal sensory exam Right Foot: tenderness to palpation medial calcaneal tuber, no pain with calcaneal squeeze, decreased ankle joint ROM, and +Silverskiold test normal exam, no swelling, tenderness, instability; ligaments intact, full range of motion of all ankle/foot joints other than findings noted above.   Assessment:   1. Plantar fasciitis of right foot     Plan:  Patient was evaluated and treated and all questions answered.  Plantar Fasciitis -Final injection today -Continue PF brace -Should issues persist consider PT or MRI, possible surgery  Procedure: Injection Tendon/Ligament Consent: Verbal consent obtained. Location: Right plantar fascia at the glabrous junction; medial approach. Skin Prep: Alcohol. Injectate: 1 cc 0.5% marcaine plain, 1 cc betamethasone acetate-betamethasone sodium phosphate Disposition: Patient tolerated procedure well. Injection site dressed with a band-aid.  No follow-ups on file.

## 2021-04-17 ENCOUNTER — Ambulatory Visit: Payer: Medicare HMO | Admitting: Podiatry

## 2021-04-30 ENCOUNTER — Ambulatory Visit: Payer: Medicare HMO | Admitting: Oncology

## 2021-05-19 ENCOUNTER — Encounter: Payer: Self-pay | Admitting: Podiatry

## 2021-05-19 ENCOUNTER — Ambulatory Visit: Payer: Medicare HMO | Admitting: Podiatry

## 2021-05-19 DIAGNOSIS — M722 Plantar fascial fibromatosis: Secondary | ICD-10-CM

## 2021-05-19 DIAGNOSIS — M216X9 Other acquired deformities of unspecified foot: Secondary | ICD-10-CM

## 2021-05-19 NOTE — Patient Instructions (Signed)

## 2021-05-19 NOTE — Progress Notes (Signed)
°  Subjective:  Patient ID: Jackie Gibson, female    DOB: 1951-01-10,  MRN: [id number]  Chief Complaint  Patient presents with   Plantar Fasciitis    The injection only lasted a few days and the brace seems to help and it feels and when I am walking it feels like a 3 or 4 on the pain scale   71 y.o. female presents with the above complaint. History confirmed with patient. Thinks changing her shoes have helped.  Objective:  Physical Exam: warm, good capillary refill, no trophic changes or ulcerative lesions, normal DP and PT pulses, and normal sensory exam Right Foot: tenderness to palpation medial calcaneal tuber, no pain with calcaneal squeeze, decreased ankle joint ROM, and +Silverskiold test normal exam, no swelling, tenderness, instability; ligaments intact, full range of motion of all ankle/foot joints other than findings noted above.   Assessment:   1. Plantar fasciitis of right foot   2. Equinus deformity of foot    Plan:  Patient was evaluated and treated and all questions answered.  Plantar Fasciitis -Improving. Will have to hold off injections for now. -Defer therapy unless this is not improving -Continue stretching and icing. -Should issues persist consider PT or MRI  Return in about 6 weeks (around 06/30/2021) for Plantar fasciitis.

## 2021-06-23 ENCOUNTER — Encounter: Payer: Self-pay | Admitting: Podiatry

## 2021-06-23 ENCOUNTER — Other Ambulatory Visit: Payer: Self-pay

## 2021-06-23 ENCOUNTER — Ambulatory Visit: Payer: Medicare HMO | Admitting: Podiatry

## 2021-06-23 DIAGNOSIS — M722 Plantar fascial fibromatosis: Secondary | ICD-10-CM | POA: Diagnosis not present

## 2021-06-23 MED ORDER — MELOXICAM 15 MG PO TABS: 15.0000 mg | ORAL_TABLET | Freq: Every day | ORAL | 0 refills | Status: DC

## 2021-06-23 MED ORDER — DEXAMETHASONE SODIUM PHOSPHATE 120 MG/30ML IJ SOLN
4.0000 mg | Freq: Once | INTRAMUSCULAR | Status: AC
  Administered 2021-06-23: 4 mg via INTRA_ARTICULAR

## 2021-06-23 NOTE — Progress Notes (Signed)
?  Subjective:  ?Patient ID: Jackie Gibson, female    DOB: Aug 06, 1950,  MRN: [id number] ? ?Chief Complaint  ?Patient presents with  ? Plantar Fasciitis  ?  6 week follow up right foot  ? ?71 y.o. female presents with the above complaint. History confirmed with patient. Thinks changing her shoes have helped. But recently had a fare and pain has worsened.  ?Objective:  ?Physical Exam: ?warm, good capillary refill, no trophic changes or ulcerative lesions, normal DP and PT pulses, and normal sensory exam ?Right Foot: tenderness to palpation medial calcaneal tuber, no pain with calcaneal squeeze, decreased ankle joint ROM, and +Silverskiold test ?normal exam, no swelling, tenderness, instability; ligaments intact, full range of motion of all ankle/foot joints other than findings noted above.  ? ?Assessment:  ? ?1. Plantar fasciitis of right foot   ? ?Plan:  ?Patient was evaluated and treated and all questions answered. ?Discussed plantar fasciitis with patient.  ?X-rays reviewed and discussed with patient. No acute fractures or dislocations noted. Mild spurring noted at inferior calcaneus.  ?Discussed treatment options including, ice, NSAIDS, supportive shoes, bracing, and stretching.  ?Will refer to PT  ?Prescription for meloxicam provided and sent to pharmacy. ?Patient requesting injection today. Procedure note below.   ?Follow-up 8 weeks or sooner if any problems arise. In the meantime, encouraged to call the office with any questions, concerns, change in symptoms.  ? ?Procedure:  ?Discussed etiology, pathology, conservative vs. surgical therapies. At this time a plantar fascial injection was recommended.  The patient agreed and a sterile skin prep was applied.  An injection consisting of  dexamethasone and marcaine mixture was infiltrated at the point of maximal tenderness on the right Heel.  Bandaid applied. The patient tolerated this well and was given instructions for aftercare.  ? ? ?No follow-ups on  file.  ? ?

## 2021-06-30 ENCOUNTER — Ambulatory Visit: Payer: Medicare HMO | Admitting: Podiatry

## 2021-07-23 NOTE — Progress Notes (Signed)
?[facility]  ?[address] ?[place],  [place]  [address] ?[phone] ? ?Clinic Day:  07/24/2021 ? ?Referring physician: Orrin Brigham MD ? ?HISTORY OF PRESENT ILLNESS:  ?The patient is a 71 y.o. female with stage IA (T1b N0 M0) hormone positive breast cancer, status post a left breast lumpectomy in July 2022.  She is currently taking anastrozole for her adjuvant endocrine therapy.  She comes in today for routine follow-up.  Since her last visit, the patient has been doing well.  She denies having any particular changes in her left breast which concern her for early disease recurrence.   ? ?Of note, this patient was diagnosed with stage IIB (T2 N1 M0) hormone positive breast cancer back in August 2012, for which she underwent a right mastectomy.  She then participated in the NSABP B-49 clinical trial, for which she was randomized to 6 cycles of Taxotere/Cytoxan.  However, she was only able to receive 3 cycles due to the severe toxicities this chemotherapy regimen caused.  This patient also underwent adjuvant breast radiation.  The patient took letrozole for 5+ years for her adjuvant endocrine therapy.   ? ?PHYSICAL EXAM:  ?Blood pressure (!) 157/72, pulse 80, temperature 98.1 ?F (36.7 ?C), resp. rate 16, height '5\' 7"'$  (1.702 m), weight 194 lb 4.8 oz (88.1 kg), SpO2 95 %. ?Wt Readings from Last 3 Encounters:  ?07/24/21 194 lb 4.8 oz (88.1 kg)  ?03/24/21 195 lb 8 oz (88.7 kg)  ?11/21/20 194 lb 1.6 oz (88 kg)  ? ?Body mass index is 30.43 kg/m?Marland Kitchen ?Performance status (ECOG): 0 - Asymptomatic ?Physical Exam ?Constitutional:   ?   Appearance: Normal appearance.  ?HENT:  ?   Mouth/Throat:  ?   Pharynx: Oropharynx is clear. No oropharyngeal exudate.  ?Cardiovascular:  ?   Rate and Rhythm: Normal rate and regular rhythm.  ?   Heart sounds: No murmur heard. ?  No friction rub. No gallop.  ?Pulmonary:  ?   Breath sounds: Normal breath sounds.  ?Chest:  ?Breasts: ?   Right: Absent. No swelling,  bleeding, inverted nipple, mass, nipple discharge or skin change.  ?   Left: Normal. No swelling, bleeding, inverted nipple, mass, nipple discharge or skin change.  ?Abdominal:  ?   General: Bowel sounds are normal. There is no distension.  ?   Palpations: Abdomen is soft. There is no mass.  ?   Tenderness: There is no abdominal tenderness.  ?Musculoskeletal:     ?   General: No tenderness.  ?   Cervical back: Normal range of motion and neck supple.  ?   Right lower leg: No edema.  ?   Left lower leg: No edema.  ?Lymphadenopathy:  ?   Cervical: No cervical adenopathy.  ?   Right cervical: No superficial, deep or posterior cervical adenopathy. ?   Left cervical: No superficial, deep or posterior cervical adenopathy.  ?   Upper Body:  ?   Right upper body: No supraclavicular or axillary adenopathy.  ?   Left upper body: No supraclavicular or axillary adenopathy.  ?   Lower Body: No right inguinal adenopathy. No left inguinal adenopathy.  ?Skin: ?   Coloration: Skin is not jaundiced.  ?   Findings: No lesion or rash.  ?Neurological:  ?   General: No focal deficit present.  ?   Mental Status: She is alert and oriented to person, place, and time. Mental status is at baseline.  ?Psychiatric:     ?  Mood and Affect: Mood normal.     ?   Behavior: Behavior normal.     ?   Thought Content: Thought content normal.     ?   Judgment: Judgment normal.  ? ?ASSESSMENT & PLAN:  ?A 71 y.o. female with stage IA (T1b N0 M0) hormone positive breast cancer, status post a left breast lumpectomy in July 2022.  Based upon her clinical breast exam today, the patient remains disease-free.  She knows to continue her anastrozole daily for her 5 years of adjuvant endocrine therapy.  With respect to her disease surveillance, I will see her back in 4 months for her next clinical breast exam.  Her annual mammogram will be scheduled before her next visit for her continued radiographic breast cancer surveillance.  The patient understands all the  plans discussed today and is in agreement with them. ? ?Marsh Heckler Macarthur Critchley, MD ? ? ? ?  ? ?

## 2021-07-24 ENCOUNTER — Other Ambulatory Visit: Payer: Self-pay

## 2021-07-24 ENCOUNTER — Telehealth: Payer: Self-pay | Admitting: Oncology

## 2021-07-24 ENCOUNTER — Other Ambulatory Visit: Payer: Self-pay | Admitting: Oncology

## 2021-07-24 ENCOUNTER — Inpatient Hospital Stay: Payer: Medicare HMO | Attending: Oncology | Admitting: Oncology

## 2021-07-24 VITALS — BP 157/72 | HR 80 | Temp 98.1°F | Resp 16 | Ht 67.0 in | Wt 194.3 lb

## 2021-07-24 DIAGNOSIS — C50412 Malignant neoplasm of upper-outer quadrant of left female breast: Secondary | ICD-10-CM

## 2021-07-24 DIAGNOSIS — Z170 Estrogen receptor positive status [ER+]: Secondary | ICD-10-CM | POA: Diagnosis not present

## 2021-07-24 NOTE — Telephone Encounter (Signed)
Per 07/24/21 los next appt scheduled and confirmed with patient ?

## 2021-07-25 DIAGNOSIS — R0602 Shortness of breath: Secondary | ICD-10-CM | POA: Diagnosis not present

## 2021-07-25 DIAGNOSIS — J302 Other seasonal allergic rhinitis: Secondary | ICD-10-CM | POA: Diagnosis not present

## 2021-07-25 DIAGNOSIS — R7303 Prediabetes: Secondary | ICD-10-CM | POA: Diagnosis not present

## 2021-07-25 DIAGNOSIS — M170 Bilateral primary osteoarthritis of knee: Secondary | ICD-10-CM | POA: Diagnosis not present

## 2021-07-25 DIAGNOSIS — M25473 Effusion, unspecified ankle: Secondary | ICD-10-CM | POA: Diagnosis not present

## 2021-07-25 DIAGNOSIS — E785 Hyperlipidemia, unspecified: Secondary | ICD-10-CM | POA: Diagnosis not present

## 2021-07-25 DIAGNOSIS — Z6830 Body mass index (BMI) 30.0-30.9, adult: Secondary | ICD-10-CM | POA: Diagnosis not present

## 2021-08-04 ENCOUNTER — Encounter: Payer: Self-pay | Admitting: Podiatry

## 2021-08-04 ENCOUNTER — Ambulatory Visit: Payer: Medicare HMO | Admitting: Podiatry

## 2021-08-04 DIAGNOSIS — M722 Plantar fascial fibromatosis: Secondary | ICD-10-CM

## 2021-08-04 DIAGNOSIS — M216X9 Other acquired deformities of unspecified foot: Secondary | ICD-10-CM | POA: Diagnosis not present

## 2021-08-04 NOTE — Progress Notes (Signed)
?  Subjective:  ?Patient ID: Jackie Gibson, female    DOB: 1950-06-08,  MRN: [id number] ? ?No chief complaint on file. ? ?71 y.o. female presents for follow-up of right foot plantar fasciitis. Relates she is about the same but a little less intense so getting better. She relates the meloxicam was causing swelling and was unable to take. She relates the shot did not help. She never went to PT because she fell . Relates ?Objective:  ?Physical Exam: ?warm, good capillary refill, no trophic changes or ulcerative lesions, normal DP and PT pulses, and normal sensory exam ?Right Foot: tenderness to palpation medial calcaneal tuber, no pain with calcaneal squeeze, decreased ankle joint ROM, and +Silverskiold test ?normal exam, no swelling, tenderness, instability; ligaments intact, full range of motion of all ankle/foot joints other than findings noted above.  ? ?Assessment:  ? ?No diagnosis found. ? ?Plan:  ?Patient was evaluated and treated and all questions answered. ?Discussed plantar fasciitis with patient.  ?X-rays reviewed and discussed with patient. No acute fractures or dislocations noted. Mild spurring noted at inferior calcaneus.  ?Discussed treatment options including, ice, NSAIDS, supportive shoes, bracing, and stretching.  ?Will hold off on PT for know  ?Continue with tylenol   ?Continue supportive shoes ?Follow-up 6 weeks or sooner if any problems arise. In the meantime, encouraged to call the office with any questions, concerns, change in symptoms.  ? ? ? ? ?No follow-ups on file.  ? ?

## 2021-08-11 DIAGNOSIS — Z170 Estrogen receptor positive status [ER+]: Secondary | ICD-10-CM | POA: Diagnosis not present

## 2021-08-11 DIAGNOSIS — Z853 Personal history of malignant neoplasm of breast: Secondary | ICD-10-CM | POA: Diagnosis not present

## 2021-08-11 DIAGNOSIS — R922 Inconclusive mammogram: Secondary | ICD-10-CM | POA: Diagnosis not present

## 2021-08-11 DIAGNOSIS — C50812 Malignant neoplasm of overlapping sites of left female breast: Secondary | ICD-10-CM | POA: Diagnosis not present

## 2021-08-21 DIAGNOSIS — Z6830 Body mass index (BMI) 30.0-30.9, adult: Secondary | ICD-10-CM | POA: Diagnosis not present

## 2021-08-21 DIAGNOSIS — R7303 Prediabetes: Secondary | ICD-10-CM | POA: Diagnosis not present

## 2021-08-21 DIAGNOSIS — E785 Hyperlipidemia, unspecified: Secondary | ICD-10-CM | POA: Diagnosis not present

## 2021-09-09 DIAGNOSIS — C50812 Malignant neoplasm of overlapping sites of left female breast: Secondary | ICD-10-CM | POA: Diagnosis not present

## 2021-09-09 DIAGNOSIS — Z170 Estrogen receptor positive status [ER+]: Secondary | ICD-10-CM | POA: Diagnosis not present

## 2021-09-15 ENCOUNTER — Ambulatory Visit: Payer: Medicare HMO | Admitting: Podiatry

## 2021-11-12 ENCOUNTER — Other Ambulatory Visit: Payer: Self-pay | Admitting: Oncology

## 2021-11-19 DIAGNOSIS — J189 Pneumonia, unspecified organism: Secondary | ICD-10-CM | POA: Diagnosis not present

## 2021-11-23 NOTE — Progress Notes (Signed)
[facility]  [address] [place],  [place]  [address] [phone]  Clinic Day:  11/24/2021  Referring physician: Orrin Brigham MD  HISTORY OF PRESENT ILLNESS:  The patient is a 71 y.o. female with stage IA (T1b N0 M0) hormone positive breast cancer, status post a left breast lumpectomy in July 2022.  She is currently taking anastrozole for her adjuvant endocrine therapy.  She comes in today for routine follow-up.  Since her last visit, the patient has been doing well.  She denies having any particular changes in her left breast which concern her for early disease recurrence.  Of note, her annual mammogram in May 2023 showed no evidence of disease recurrence.  Of note, this patient was diagnosed with stage IIB (T2 N1 M0) hormone positive breast cancer back in August 2012, for which she underwent a right mastectomy.  She then participated in the NSABP B-49 clinical trial, for which she was randomized to 6 cycles of Taxotere/Cytoxan.  However, she was only able to receive 3 cycles due to the severe toxicities this chemotherapy regimen caused.  This patient also underwent adjuvant breast radiation.  The patient took letrozole for 5+ years for her adjuvant endocrine therapy.    PHYSICAL EXAM:  Blood pressure (!) 146/69, pulse (!) 102, temperature 98.2 F (36.8 C), resp. rate 14, height '5\' 7"'$  (1.702 m), weight 191 lb 12.8 oz (87 kg), SpO2 94 %. Wt Readings from Last 3 Encounters:  11/24/21 191 lb 12.8 oz (87 kg)  07/24/21 194 lb 4.8 oz (88.1 kg)  03/24/21 195 lb 8 oz (88.7 kg)   Body mass index is 30.04 kg/m. Performance status (ECOG): 0 - Asymptomatic Physical Exam Constitutional:      Appearance: Normal appearance.  HENT:     Mouth/Throat:     Pharynx: Oropharynx is clear. No oropharyngeal exudate.  Cardiovascular:     Rate and Rhythm: Normal rate and regular rhythm.     Heart sounds: No murmur heard.    No friction rub. No gallop.  Pulmonary:      Breath sounds: Normal breath sounds.  Chest:  Breasts:    Right: Absent. No swelling, bleeding, inverted nipple, mass, nipple discharge or skin change.     Left: Normal. No swelling, bleeding, inverted nipple, mass, nipple discharge or skin change.  Abdominal:     General: Bowel sounds are normal. There is no distension.     Palpations: Abdomen is soft. There is no mass.     Tenderness: There is no abdominal tenderness.  Musculoskeletal:        General: No tenderness.     Cervical back: Normal range of motion and neck supple.     Right lower leg: No edema.     Left lower leg: No edema.  Lymphadenopathy:     Cervical: No cervical adenopathy.     Right cervical: No superficial, deep or posterior cervical adenopathy.    Left cervical: No superficial, deep or posterior cervical adenopathy.     Upper Body:     Right upper body: No supraclavicular or axillary adenopathy.     Left upper body: No supraclavicular or axillary adenopathy.     Lower Body: No right inguinal adenopathy. No left inguinal adenopathy.  Skin:    Coloration: Skin is not jaundiced.     Findings: No lesion or rash.  Neurological:     General: No focal deficit present.     Mental Status: She is alert and oriented  to person, place, and time. Mental status is at baseline.  Psychiatric:        Mood and Affect: Mood normal.        Behavior: Behavior normal.        Thought Content: Thought content normal.        Judgment: Judgment normal.    ASSESSMENT & PLAN:  A 71 y.o. female with stage IA (T1b N0 M0) hormone positive breast cancer, status post a left breast lumpectomy in July 2022.  Based upon her clinical breast exam today, the patient remains disease-free.  She knows to continue her anastrozole daily for her 5 years of adjuvant endocrine therapy.  With respect to her disease surveillance, I will see her back in 4 months for her next clinical breast exam.  The patient understands all the plans discussed today and is in  agreement with them.  Waino Mounsey Macarthur Critchley, MD

## 2021-11-24 ENCOUNTER — Other Ambulatory Visit: Payer: Self-pay | Admitting: Oncology

## 2021-11-24 ENCOUNTER — Inpatient Hospital Stay: Payer: Medicare HMO | Attending: Oncology | Admitting: Oncology

## 2021-11-24 VITALS — BP 146/69 | HR 102 | Temp 98.2°F | Resp 14 | Ht 67.0 in | Wt 191.8 lb

## 2021-11-24 DIAGNOSIS — C50412 Malignant neoplasm of upper-outer quadrant of left female breast: Secondary | ICD-10-CM | POA: Diagnosis not present

## 2021-11-24 DIAGNOSIS — Z170 Estrogen receptor positive status [ER+]: Secondary | ICD-10-CM

## 2021-11-24 MED ORDER — ANASTROZOLE 1 MG PO TABS: 1.0000 mg | ORAL_TABLET | Freq: Every day | ORAL | 3 refills | Status: AC

## 2022-01-27 DIAGNOSIS — M2021 Hallux rigidus, right foot: Secondary | ICD-10-CM | POA: Diagnosis not present

## 2022-01-27 DIAGNOSIS — G8929 Other chronic pain: Secondary | ICD-10-CM | POA: Diagnosis not present

## 2022-01-27 DIAGNOSIS — M25561 Pain in right knee: Secondary | ICD-10-CM | POA: Diagnosis not present

## 2022-01-27 DIAGNOSIS — M722 Plantar fascial fibromatosis: Secondary | ICD-10-CM | POA: Diagnosis not present

## 2022-01-27 DIAGNOSIS — M1711 Unilateral primary osteoarthritis, right knee: Secondary | ICD-10-CM | POA: Diagnosis not present

## 2022-02-18 DIAGNOSIS — G622 Polyneuropathy due to other toxic agents: Secondary | ICD-10-CM | POA: Diagnosis not present

## 2022-02-18 DIAGNOSIS — J449 Chronic obstructive pulmonary disease, unspecified: Secondary | ICD-10-CM | POA: Diagnosis not present

## 2022-02-18 DIAGNOSIS — Z170 Estrogen receptor positive status [ER+]: Secondary | ICD-10-CM | POA: Diagnosis not present

## 2022-02-18 DIAGNOSIS — M1711 Unilateral primary osteoarthritis, right knee: Secondary | ICD-10-CM | POA: Diagnosis not present

## 2022-02-18 DIAGNOSIS — E1169 Type 2 diabetes mellitus with other specified complication: Secondary | ICD-10-CM | POA: Diagnosis not present

## 2022-02-18 DIAGNOSIS — Z0000 Encounter for general adult medical examination without abnormal findings: Secondary | ICD-10-CM | POA: Diagnosis not present

## 2022-02-18 DIAGNOSIS — I700 Atherosclerosis of aorta: Secondary | ICD-10-CM | POA: Diagnosis not present

## 2022-02-18 DIAGNOSIS — C50919 Malignant neoplasm of unspecified site of unspecified female breast: Secondary | ICD-10-CM | POA: Diagnosis not present

## 2022-02-18 DIAGNOSIS — Z6829 Body mass index (BMI) 29.0-29.9, adult: Secondary | ICD-10-CM | POA: Diagnosis not present

## 2022-02-18 DIAGNOSIS — E785 Hyperlipidemia, unspecified: Secondary | ICD-10-CM | POA: Diagnosis not present

## 2022-02-18 DIAGNOSIS — Z1331 Encounter for screening for depression: Secondary | ICD-10-CM | POA: Diagnosis not present

## 2022-02-18 DIAGNOSIS — R7303 Prediabetes: Secondary | ICD-10-CM | POA: Diagnosis not present

## 2022-02-18 DIAGNOSIS — Z79899 Other long term (current) drug therapy: Secondary | ICD-10-CM | POA: Diagnosis not present

## 2022-02-19 DIAGNOSIS — M25561 Pain in right knee: Secondary | ICD-10-CM | POA: Diagnosis not present

## 2022-02-19 DIAGNOSIS — G8929 Other chronic pain: Secondary | ICD-10-CM | POA: Diagnosis not present

## 2022-03-26 ENCOUNTER — Ambulatory Visit: Payer: Medicare HMO | Admitting: Oncology

## 2022-03-27 DIAGNOSIS — R051 Acute cough: Secondary | ICD-10-CM | POA: Diagnosis not present

## 2022-03-27 DIAGNOSIS — J22 Unspecified acute lower respiratory infection: Secondary | ICD-10-CM | POA: Diagnosis not present

## 2022-04-14 ENCOUNTER — Ambulatory Visit: Payer: Medicare HMO | Admitting: Oncology

## 2022-04-14 NOTE — Progress Notes (Deleted)
[facility]  [address] [place],  [place]  [address] [phone]  Clinic Day:  11/24/2021  Referring physician: Orrin Brigham MD  HISTORY OF PRESENT ILLNESS:  The patient is a 72 y.o. female with stage IA (T1b N0 M0) hormone positive breast cancer, status post a left breast lumpectomy in July 2022.  She is currently taking anastrozole for her adjuvant endocrine therapy.  She comes in today for routine follow-up.  Since her last visit, the patient has been doing well.  She denies having any particular changes in her left breast which concern her for early disease recurrence.  Of note, her annual mammogram in May 2023 showed no evidence of disease recurrence.  Of note, this patient was diagnosed with stage IIB (T2 N1 M0) hormone positive breast cancer back in August 2012, for which she underwent a right mastectomy.  She then participated in the NSABP B-49 clinical trial, for which she was randomized to 6 cycles of Taxotere/Cytoxan.  However, she was only able to receive 3 cycles due to the severe toxicities this chemotherapy regimen caused.  This patient also underwent adjuvant breast radiation.  The patient took letrozole for 5+ years for her adjuvant endocrine therapy.    PHYSICAL EXAM:  There were no vitals taken for this visit. Wt Readings from Last 3 Encounters:  11/24/21 191 lb 12.8 oz (87 kg)  07/24/21 194 lb 4.8 oz (88.1 kg)  03/24/21 195 lb 8 oz (88.7 kg)   There is no height or weight on file to calculate BMI. Performance status (ECOG): 0 - Asymptomatic Physical Exam Constitutional:      Appearance: Normal appearance.  HENT:     Mouth/Throat:     Pharynx: Oropharynx is clear. No oropharyngeal exudate.  Cardiovascular:     Rate and Rhythm: Normal rate and regular rhythm.     Heart sounds: No murmur heard.    No friction rub. No gallop.  Pulmonary:     Breath sounds: Normal breath sounds.  Chest:  Breasts:    Right: Absent. No swelling,  bleeding, inverted nipple, mass, nipple discharge or skin change.     Left: Normal. No swelling, bleeding, inverted nipple, mass, nipple discharge or skin change.  Abdominal:     General: Bowel sounds are normal. There is no distension.     Palpations: Abdomen is soft. There is no mass.     Tenderness: There is no abdominal tenderness.  Musculoskeletal:        General: No tenderness.     Cervical back: Normal range of motion and neck supple.     Right lower leg: No edema.     Left lower leg: No edema.  Lymphadenopathy:     Cervical: No cervical adenopathy.     Right cervical: No superficial, deep or posterior cervical adenopathy.    Left cervical: No superficial, deep or posterior cervical adenopathy.     Upper Body:     Right upper body: No supraclavicular or axillary adenopathy.     Left upper body: No supraclavicular or axillary adenopathy.     Lower Body: No right inguinal adenopathy. No left inguinal adenopathy.  Skin:    Coloration: Skin is not jaundiced.     Findings: No lesion or rash.  Neurological:     General: No focal deficit present.     Mental Status: She is alert and oriented to person, place, and time. Mental status is at baseline.  Psychiatric:  Mood and Affect: Mood normal.        Behavior: Behavior normal.        Thought Content: Thought content normal.        Judgment: Judgment normal.    ASSESSMENT & PLAN:  A 72 y.o. female with stage IA (T1b N0 M0) hormone positive breast cancer, status post a left breast lumpectomy in July 2022.  Based upon her clinical breast exam today, the patient remains disease-free.  She knows to continue her anastrozole daily for her 5 years of adjuvant endocrine therapy.  With respect to her disease surveillance, I will see her back in 4 months for her next clinical breast exam.  The patient understands all the plans discussed today and is in agreement with them.  Reade Trefz Macarthur Critchley, MD

## 2022-04-22 NOTE — Progress Notes (Signed)
[facility]  [address] [place],  [place]  [address] [phone]  Clinic Day:  04/23/2022  Referring physician: Orrin Brigham MD  HISTORY OF PRESENT ILLNESS:  The patient is a 72 y.o. female with stage IA (T1b N0 M0) hormone positive breast cancer, status post a left breast lumpectomy in July 2022.  She is currently taking anastrozole for her adjuvant endocrine therapy.  She comes in today for routine follow-up.  Since her last visit, the patient has been doing well.  She denies having any particular changes in her left breast which concern her for early disease recurrence.    Of note, this patient was diagnosed with stage IIB (T2 N1 M0) hormone positive breast cancer back in August 2012, for which she underwent a right mastectomy.  She then participated in the NSABP B-49 clinical trial, for which she was randomized to 6 cycles of Taxotere/Cytoxan.  However, she was only able to receive 3 cycles due to the severe toxicities this chemotherapy regimen caused.  This patient also underwent adjuvant breast radiation.  The patient took letrozole for 5+ years for her adjuvant endocrine therapy.    PHYSICAL EXAM:  Blood pressure (!) 149/65, pulse 85, temperature 98.8 F (37.1 C), resp. rate 16, height '5\' 7"'$  (1.702 m), weight 193 lb 1.6 oz (87.6 kg), SpO2 95 %. Wt Readings from Last 3 Encounters:  04/23/22 193 lb 1.6 oz (87.6 kg)  11/24/21 191 lb 12.8 oz (87 kg)  07/24/21 194 lb 4.8 oz (88.1 kg)   Body mass index is 30.24 kg/m. Performance status (ECOG): 0 - Asymptomatic Physical Exam Constitutional:      Appearance: Normal appearance.  HENT:     Mouth/Throat:     Pharynx: Oropharynx is clear. No oropharyngeal exudate.  Cardiovascular:     Rate and Rhythm: Normal rate and regular rhythm.     Heart sounds: No murmur heard.    No friction rub. No gallop.  Pulmonary:     Breath sounds: Normal breath sounds.  Chest:  Breasts:    Right: Absent. No  swelling, bleeding, inverted nipple, mass, nipple discharge or skin change.     Left: Normal. No swelling, bleeding, inverted nipple, mass, nipple discharge or skin change.  Abdominal:     General: Bowel sounds are normal. There is no distension.     Palpations: Abdomen is soft. There is no mass.     Tenderness: There is no abdominal tenderness.  Musculoskeletal:        General: No tenderness.     Cervical back: Normal range of motion and neck supple.     Right lower leg: No edema.     Left lower leg: No edema.  Lymphadenopathy:     Cervical: No cervical adenopathy.     Right cervical: No superficial, deep or posterior cervical adenopathy.    Left cervical: No superficial, deep or posterior cervical adenopathy.     Upper Body:     Right upper body: No supraclavicular or axillary adenopathy.     Left upper body: No supraclavicular or axillary adenopathy.     Lower Body: No right inguinal adenopathy. No left inguinal adenopathy.  Skin:    Coloration: Skin is not jaundiced.     Findings: No lesion or rash.  Neurological:     General: No focal deficit present.     Mental Status: She is alert and oriented to person, place, and time. Mental status is at baseline.  Psychiatric:  Mood and Affect: Mood normal.        Behavior: Behavior normal.        Thought Content: Thought content normal.        Judgment: Judgment normal.    ASSESSMENT & PLAN:  A 72 y.o. female with stage IA (T1b N0 M0) hormone positive breast cancer, status post a left breast lumpectomy in July 2022.  Based upon her clinical breast exam today, the patient remains disease-free.  She knows to continue taking her anastrozole daily for her 5 years of adjuvant endocrine therapy.  With respect to her disease surveillance, I will see her back in 4 months for her next clinical breast exam.  Her annual mammogram will be scheduled before her next visit for her continued radiographic breast cancer surveillance.  I will also  have her undergo a bone density study to ensure her anastrozole has not led to any significant bone loss.  If her next mammogram and clinical breast exam are both normal, I will begin spacing all future visits out to every 6 months.  The patient understands all the plans discussed today and is in agreement with them.  Lurlene Ronda Macarthur Critchley, MD

## 2022-04-23 ENCOUNTER — Inpatient Hospital Stay: Payer: Medicare HMO | Attending: Oncology | Admitting: Oncology

## 2022-04-23 ENCOUNTER — Other Ambulatory Visit: Payer: Self-pay | Admitting: Oncology

## 2022-04-23 VITALS — BP 149/65 | HR 85 | Temp 98.8°F | Resp 16 | Ht 67.0 in | Wt 193.1 lb

## 2022-04-23 DIAGNOSIS — Z170 Estrogen receptor positive status [ER+]: Secondary | ICD-10-CM

## 2022-04-23 DIAGNOSIS — C50412 Malignant neoplasm of upper-outer quadrant of left female breast: Secondary | ICD-10-CM

## 2022-06-04 DIAGNOSIS — M1711 Unilateral primary osteoarthritis, right knee: Secondary | ICD-10-CM | POA: Diagnosis not present

## 2022-07-02 DIAGNOSIS — R0600 Dyspnea, unspecified: Secondary | ICD-10-CM | POA: Diagnosis not present

## 2022-07-02 DIAGNOSIS — J449 Chronic obstructive pulmonary disease, unspecified: Secondary | ICD-10-CM | POA: Diagnosis not present

## 2022-07-02 DIAGNOSIS — R0602 Shortness of breath: Secondary | ICD-10-CM | POA: Diagnosis not present

## 2022-07-30 NOTE — Telephone Encounter (Signed)
Done

## 2022-08-03 DIAGNOSIS — E785 Hyperlipidemia, unspecified: Secondary | ICD-10-CM | POA: Diagnosis not present

## 2022-08-03 DIAGNOSIS — I700 Atherosclerosis of aorta: Secondary | ICD-10-CM | POA: Diagnosis not present

## 2022-08-03 DIAGNOSIS — K439 Ventral hernia without obstruction or gangrene: Secondary | ICD-10-CM | POA: Diagnosis not present

## 2022-08-03 DIAGNOSIS — G622 Polyneuropathy due to other toxic agents: Secondary | ICD-10-CM | POA: Diagnosis not present

## 2022-08-03 DIAGNOSIS — J449 Chronic obstructive pulmonary disease, unspecified: Secondary | ICD-10-CM | POA: Diagnosis not present

## 2022-08-03 DIAGNOSIS — E1169 Type 2 diabetes mellitus with other specified complication: Secondary | ICD-10-CM | POA: Diagnosis not present

## 2022-08-03 DIAGNOSIS — M1711 Unilateral primary osteoarthritis, right knee: Secondary | ICD-10-CM | POA: Diagnosis not present

## 2022-08-03 DIAGNOSIS — Z6829 Body mass index (BMI) 29.0-29.9, adult: Secondary | ICD-10-CM | POA: Diagnosis not present

## 2022-08-03 DIAGNOSIS — C50912 Malignant neoplasm of unspecified site of left female breast: Secondary | ICD-10-CM | POA: Diagnosis not present

## 2022-08-19 NOTE — Progress Notes (Signed)
[facility]  [address] [place],  [place]  [address] [phone]  Clinic Day:  08/20/2022  Referring physician: Marvis Moeller MD  HISTORY OF PRESENT ILLNESS:  The patient is a 72 y.o. female with stage IA (T1b N0 M0) hormone positive breast cancer, status post a left breast lumpectomy in July 2022.  She is currently taking anastrozole for her adjuvant endocrine therapy.  She comes in today for routine follow-up.  Since her last visit, the patient has been doing well.  She denies having any particular changes in her left breast which concern her for early disease recurrence.  Of note, she missed her previously scheduled mammogram and bone density study.  Both have been rescheduled in the forthcoming week.  Of note, this patient was diagnosed with stage IIB (T2 N1 M0) hormone positive breast cancer back in August 2012, for which she underwent a right mastectomy.  She then participated in the NSABP B-49 clinical trial, for which she was randomized to 6 cycles of Taxotere/Cytoxan.  However, she was only able to receive 3 cycles due to the severe toxicities this chemotherapy regimen caused.  This patient also underwent adjuvant breast radiation.  The patient took letrozole for 5+ years for her adjuvant endocrine therapy.    PHYSICAL EXAM:  Blood pressure (!) 142/65, pulse 67, temperature 97.7 F (36.5 C), temperature source Oral, resp. rate 18, height 5\' 7"  (1.702 m), weight 186 lb 3.2 oz (84.5 kg), SpO2 94 %. Wt Readings from Last 3 Encounters:  08/20/22 186 lb 3.2 oz (84.5 kg)  04/23/22 193 lb 1.6 oz (87.6 kg)  11/24/21 191 lb 12.8 oz (87 kg)   Body mass index is 29.16 kg/m. Performance status (ECOG): 0 - Asymptomatic Physical Exam Constitutional:      Appearance: Normal appearance.  HENT:     Mouth/Throat:     Pharynx: Oropharynx is clear. No oropharyngeal exudate.  Cardiovascular:     Rate and Rhythm: Normal rate and regular rhythm.     Heart  sounds: No murmur heard.    No friction rub. No gallop.  Pulmonary:     Breath sounds: Normal breath sounds.  Chest:  Breasts:    Right: Absent. No swelling, bleeding, inverted nipple, mass, nipple discharge or skin change.     Left: Normal. No swelling, bleeding, inverted nipple, mass, nipple discharge or skin change.  Abdominal:     General: Bowel sounds are normal. There is no distension.     Palpations: Abdomen is soft. There is no mass.     Tenderness: There is no abdominal tenderness.  Musculoskeletal:        General: No tenderness.     Cervical back: Normal range of motion and neck supple.     Right lower leg: No edema.     Left lower leg: No edema.  Lymphadenopathy:     Cervical: No cervical adenopathy.     Right cervical: No superficial, deep or posterior cervical adenopathy.    Left cervical: No superficial, deep or posterior cervical adenopathy.     Upper Body:     Right upper body: No supraclavicular or axillary adenopathy.     Left upper body: No supraclavicular or axillary adenopathy.     Lower Body: No right inguinal adenopathy. No left inguinal adenopathy.  Skin:    Coloration: Skin is not jaundiced.     Findings: No lesion or rash.  Neurological:     General: No focal deficit present.  Mental Status: She is alert and oriented to person, place, and time. Mental status is at baseline.  Psychiatric:        Mood and Affect: Mood normal.        Behavior: Behavior normal.        Thought Content: Thought content normal.        Judgment: Judgment normal.    ASSESSMENT & PLAN:  A 72 y.o. female with stage IA (T1b N0 M0) hormone positive breast cancer, status post a left breast lumpectomy in July 2022.  Based upon her clinical breast exam today, the patient remains disease-free.  She knows to continue taking her anastrozole daily for her 5 years of adjuvant endocrine therapy.  Her annual mammogram is scheduled for next week for her continued radiographic breast cancer  surveillance.  Her bone density study is scheduled for tomorrow.  Clinically, the patient is doing well.  I will see her back in 6 months for her next clinical breast exam.  The patient understands all the plans discussed today and is in agreement with them.  Alexandre Lightsey Kirby [place], MD

## 2022-08-20 ENCOUNTER — Inpatient Hospital Stay: Payer: Medicare HMO | Attending: Oncology | Admitting: Oncology

## 2022-08-20 VITALS — BP 142/65 | HR 67 | Temp 97.7°F | Resp 18 | Ht 67.0 in | Wt 186.2 lb

## 2022-08-20 DIAGNOSIS — C50412 Malignant neoplasm of upper-outer quadrant of left female breast: Secondary | ICD-10-CM | POA: Diagnosis not present

## 2022-08-20 DIAGNOSIS — Z170 Estrogen receptor positive status [ER+]: Secondary | ICD-10-CM | POA: Diagnosis not present

## 2022-08-21 DIAGNOSIS — M8589 Other specified disorders of bone density and structure, multiple sites: Secondary | ICD-10-CM | POA: Diagnosis not present

## 2022-08-21 DIAGNOSIS — M810 Age-related osteoporosis without current pathological fracture: Secondary | ICD-10-CM | POA: Diagnosis not present

## 2022-08-21 LAB — HM DEXA SCAN

## 2022-08-24 ENCOUNTER — Ambulatory Visit: Payer: Medicare HMO | Admitting: Oncology

## 2022-08-26 DIAGNOSIS — R92332 Mammographic heterogeneous density, left breast: Secondary | ICD-10-CM | POA: Diagnosis not present

## 2022-08-26 DIAGNOSIS — Z853 Personal history of malignant neoplasm of breast: Secondary | ICD-10-CM | POA: Diagnosis not present

## 2022-09-02 ENCOUNTER — Encounter: Payer: Self-pay | Admitting: Oncology

## 2022-10-06 DIAGNOSIS — L570 Actinic keratosis: Secondary | ICD-10-CM | POA: Diagnosis not present

## 2022-10-06 DIAGNOSIS — D485 Neoplasm of uncertain behavior of skin: Secondary | ICD-10-CM | POA: Diagnosis not present

## 2022-10-06 DIAGNOSIS — L820 Inflamed seborrheic keratosis: Secondary | ICD-10-CM | POA: Diagnosis not present

## 2022-10-14 ENCOUNTER — Telehealth: Payer: Self-pay

## 2022-10-14 NOTE — Patient Outreach (Signed)
  Care Coordination   10/14/2022 Name: Jackie Gibson MRN: [id number] DOB: [birth year]   Care Coordination Outreach Attempts:  An unsuccessful telephone outreach was attempted today to offer the patient information about available care coordination services.  Follow Up Plan:  Additional outreach attempts will be made to offer the patient care coordination information and services.   Encounter Outcome:  No Answer   Care Coordination Interventions:  No, not indicated    Rowe Pavy, RN, BSN, [facility] [employer] [facility] [phone]

## 2022-11-22 ENCOUNTER — Other Ambulatory Visit: Payer: Self-pay | Admitting: Oncology

## 2022-12-03 DIAGNOSIS — M85861 Other specified disorders of bone density and structure, right lower leg: Secondary | ICD-10-CM | POA: Diagnosis not present

## 2022-12-03 DIAGNOSIS — M25561 Pain in right knee: Secondary | ICD-10-CM | POA: Diagnosis not present

## 2022-12-03 DIAGNOSIS — M1711 Unilateral primary osteoarthritis, right knee: Secondary | ICD-10-CM | POA: Diagnosis not present

## 2022-12-03 DIAGNOSIS — M25461 Effusion, right knee: Secondary | ICD-10-CM | POA: Diagnosis not present

## 2022-12-03 DIAGNOSIS — G8929 Other chronic pain: Secondary | ICD-10-CM | POA: Diagnosis not present

## 2022-12-04 DIAGNOSIS — H5213 Myopia, bilateral: Secondary | ICD-10-CM | POA: Diagnosis not present

## 2022-12-04 DIAGNOSIS — H52223 Regular astigmatism, bilateral: Secondary | ICD-10-CM | POA: Diagnosis not present

## 2022-12-04 DIAGNOSIS — H524 Presbyopia: Secondary | ICD-10-CM | POA: Diagnosis not present

## 2022-12-17 DIAGNOSIS — M19271 Secondary osteoarthritis, right ankle and foot: Secondary | ICD-10-CM | POA: Diagnosis not present

## 2022-12-17 DIAGNOSIS — M2011 Hallux valgus (acquired), right foot: Secondary | ICD-10-CM | POA: Diagnosis not present

## 2022-12-17 DIAGNOSIS — M7731 Calcaneal spur, right foot: Secondary | ICD-10-CM | POA: Diagnosis not present

## 2022-12-17 DIAGNOSIS — S92514A Nondisplaced fracture of proximal phalanx of right lesser toe(s), initial encounter for closed fracture: Secondary | ICD-10-CM | POA: Diagnosis not present

## 2023-01-28 DIAGNOSIS — L719 Rosacea, unspecified: Secondary | ICD-10-CM | POA: Diagnosis not present

## 2023-01-28 DIAGNOSIS — L821 Other seborrheic keratosis: Secondary | ICD-10-CM | POA: Diagnosis not present

## 2023-03-01 DIAGNOSIS — E1169 Type 2 diabetes mellitus with other specified complication: Secondary | ICD-10-CM | POA: Diagnosis not present

## 2023-03-01 DIAGNOSIS — Z6830 Body mass index (BMI) 30.0-30.9, adult: Secondary | ICD-10-CM | POA: Diagnosis not present

## 2023-03-01 DIAGNOSIS — J449 Chronic obstructive pulmonary disease, unspecified: Secondary | ICD-10-CM | POA: Diagnosis not present

## 2023-03-01 DIAGNOSIS — Z0000 Encounter for general adult medical examination without abnormal findings: Secondary | ICD-10-CM | POA: Diagnosis not present

## 2023-03-01 DIAGNOSIS — I700 Atherosclerosis of aorta: Secondary | ICD-10-CM | POA: Diagnosis not present

## 2023-03-01 DIAGNOSIS — G622 Polyneuropathy due to other toxic agents: Secondary | ICD-10-CM | POA: Diagnosis not present

## 2023-03-01 DIAGNOSIS — E559 Vitamin D deficiency, unspecified: Secondary | ICD-10-CM | POA: Diagnosis not present

## 2023-03-01 DIAGNOSIS — E785 Hyperlipidemia, unspecified: Secondary | ICD-10-CM | POA: Diagnosis not present

## 2023-03-01 DIAGNOSIS — M1711 Unilateral primary osteoarthritis, right knee: Secondary | ICD-10-CM | POA: Diagnosis not present

## 2023-03-01 DIAGNOSIS — Z1331 Encounter for screening for depression: Secondary | ICD-10-CM | POA: Diagnosis not present

## 2023-03-01 DIAGNOSIS — M2550 Pain in unspecified joint: Secondary | ICD-10-CM | POA: Diagnosis not present

## 2023-03-01 DIAGNOSIS — L719 Rosacea, unspecified: Secondary | ICD-10-CM | POA: Diagnosis not present

## 2023-05-06 DIAGNOSIS — G8929 Other chronic pain: Secondary | ICD-10-CM | POA: Diagnosis not present

## 2023-05-06 DIAGNOSIS — M25561 Pain in right knee: Secondary | ICD-10-CM | POA: Diagnosis not present

## 2023-05-06 DIAGNOSIS — M1711 Unilateral primary osteoarthritis, right knee: Secondary | ICD-10-CM | POA: Diagnosis not present

## 2023-05-26 ENCOUNTER — Encounter: Payer: Self-pay | Admitting: Genetic Counselor

## 2023-06-15 DIAGNOSIS — Z6829 Body mass index (BMI) 29.0-29.9, adult: Secondary | ICD-10-CM | POA: Diagnosis not present

## 2023-06-15 DIAGNOSIS — L719 Rosacea, unspecified: Secondary | ICD-10-CM | POA: Diagnosis not present

## 2023-06-15 DIAGNOSIS — J449 Chronic obstructive pulmonary disease, unspecified: Secondary | ICD-10-CM | POA: Diagnosis not present

## 2023-06-15 DIAGNOSIS — C50919 Malignant neoplasm of unspecified site of unspecified female breast: Secondary | ICD-10-CM | POA: Diagnosis not present

## 2023-06-15 DIAGNOSIS — E785 Hyperlipidemia, unspecified: Secondary | ICD-10-CM | POA: Diagnosis not present

## 2023-06-15 DIAGNOSIS — E1169 Type 2 diabetes mellitus with other specified complication: Secondary | ICD-10-CM | POA: Diagnosis not present

## 2023-06-15 DIAGNOSIS — R143 Flatulence: Secondary | ICD-10-CM | POA: Diagnosis not present

## 2023-06-22 DIAGNOSIS — Z79899 Other long term (current) drug therapy: Secondary | ICD-10-CM | POA: Diagnosis not present

## 2023-06-22 DIAGNOSIS — E1169 Type 2 diabetes mellitus with other specified complication: Secondary | ICD-10-CM | POA: Diagnosis not present

## 2023-06-22 DIAGNOSIS — E785 Hyperlipidemia, unspecified: Secondary | ICD-10-CM | POA: Diagnosis not present

## 2023-06-22 DIAGNOSIS — E559 Vitamin D deficiency, unspecified: Secondary | ICD-10-CM | POA: Diagnosis not present

## 2023-07-09 DIAGNOSIS — R768 Other specified abnormal immunological findings in serum: Secondary | ICD-10-CM | POA: Diagnosis not present

## 2023-07-09 DIAGNOSIS — M150 Primary generalized (osteo)arthritis: Secondary | ICD-10-CM | POA: Diagnosis not present

## 2023-08-26 DIAGNOSIS — C50812 Malignant neoplasm of overlapping sites of left female breast: Secondary | ICD-10-CM | POA: Diagnosis not present

## 2023-08-26 DIAGNOSIS — Z170 Estrogen receptor positive status [ER+]: Secondary | ICD-10-CM | POA: Diagnosis not present

## 2023-08-26 DIAGNOSIS — Z853 Personal history of malignant neoplasm of breast: Secondary | ICD-10-CM | POA: Diagnosis not present

## 2023-08-26 DIAGNOSIS — N6002 Solitary cyst of left breast: Secondary | ICD-10-CM | POA: Diagnosis not present

## 2023-09-07 DIAGNOSIS — C50812 Malignant neoplasm of overlapping sites of left female breast: Secondary | ICD-10-CM | POA: Diagnosis not present

## 2023-09-07 DIAGNOSIS — Z170 Estrogen receptor positive status [ER+]: Secondary | ICD-10-CM | POA: Diagnosis not present

## 2023-11-04 DIAGNOSIS — E785 Hyperlipidemia, unspecified: Secondary | ICD-10-CM | POA: Diagnosis not present

## 2023-11-04 DIAGNOSIS — C50919 Malignant neoplasm of unspecified site of unspecified female breast: Secondary | ICD-10-CM | POA: Diagnosis not present

## 2023-11-04 DIAGNOSIS — Z6829 Body mass index (BMI) 29.0-29.9, adult: Secondary | ICD-10-CM | POA: Diagnosis not present

## 2023-11-04 DIAGNOSIS — E1169 Type 2 diabetes mellitus with other specified complication: Secondary | ICD-10-CM | POA: Diagnosis not present

## 2023-11-04 DIAGNOSIS — R4589 Other symptoms and signs involving emotional state: Secondary | ICD-10-CM | POA: Diagnosis not present

## 2023-11-04 DIAGNOSIS — N9089 Other specified noninflammatory disorders of vulva and perineum: Secondary | ICD-10-CM | POA: Diagnosis not present

## 2023-11-04 DIAGNOSIS — J449 Chronic obstructive pulmonary disease, unspecified: Secondary | ICD-10-CM | POA: Diagnosis not present

## 2023-11-04 DIAGNOSIS — G622 Polyneuropathy due to other toxic agents: Secondary | ICD-10-CM | POA: Diagnosis not present

## 2023-11-11 DIAGNOSIS — H25813 Combined forms of age-related cataract, bilateral: Secondary | ICD-10-CM | POA: Diagnosis not present

## 2023-11-11 DIAGNOSIS — H35373 Puckering of macula, bilateral: Secondary | ICD-10-CM | POA: Diagnosis not present
